# Patient Record
Sex: Female | Born: 1990 | Race: Black or African American | Hispanic: No | Marital: Single | Smoking: Never smoker
Health system: Southern US, Community
[De-identification: ages and names within clinical notes are randomized; demographics above are authoritative.]

## PROBLEM LIST (undated history)

## (undated) ENCOUNTER — Emergency Department: Admission: EM | Payer: BC Managed Care – PPO | Source: Home / Self Care

## (undated) DIAGNOSIS — R011 Cardiac murmur, unspecified: Secondary | ICD-10-CM

---

## 2017-06-29 DIAGNOSIS — F319 Bipolar disorder, unspecified: Secondary | ICD-10-CM | POA: Insufficient documentation

## 2019-04-04 ENCOUNTER — Encounter: Payer: Self-pay | Admitting: Physician Assistant

## 2019-04-04 ENCOUNTER — Other Ambulatory Visit: Payer: Self-pay

## 2019-04-04 ENCOUNTER — Ambulatory Visit: Payer: Self-pay | Admitting: Physician Assistant

## 2019-04-04 DIAGNOSIS — Z113 Encounter for screening for infections with a predominantly sexual mode of transmission: Secondary | ICD-10-CM

## 2019-04-04 LAB — PREGNANCY, URINE: Preg Test, Ur: NEGATIVE

## 2019-04-04 LAB — WET PREP FOR TRICH, YEAST, CLUE
Trichomonas Exam: NEGATIVE
Yeast Exam: NEGATIVE

## 2019-04-04 NOTE — Progress Notes (Signed)
  [facility] Department STI clinic/screening visit  Subjective:  Katherine Herring is a 29 y.o. female being seen today for an STI screening visit. The patient reports they do not have symptoms.  Patient reports that they do not desire a pregnancy in the next year.   They reported they are not interested in discussing contraception today.  Patient's last menstrual period was 03/28/2019 (exact date).   Patient has the following medical conditions:  There are no problems to display for this patient.   Chief Complaint  Patient presents with  . SEXUALLY TRANSMITTED DISEASE    HPI  Patient reports that she is not having any symptoms but would like a screening today.  Reports that her last period was shorter and lighter than usual and would like a pregnancy test to confirm that she is not pregnant.  See flowsheet for further details and programmatic requirements.    The following portions of the patient's history were reviewed and updated as appropriate: allergies, current medications, past medical history, past social history, past surgical history and problem list.  Objective:  There were no vitals filed for this visit.  Physical Exam Constitutional:      General: She is not in acute distress.    Appearance: Normal appearance. She is normal weight.  HENT:     Head: Normocephalic and atraumatic.     Comments: No nits, lice, or hair loss. No cervical, supraclavicular or axillary adenopathy.    Mouth/Throat:     Mouth: Mucous membranes are moist.     Pharynx: Oropharynx is clear. No oropharyngeal exudate or posterior oropharyngeal erythema.  Eyes:     Conjunctiva/sclera: Conjunctivae normal.  Pulmonary:     Effort: Pulmonary effort is normal.  Abdominal:     Palpations: Abdomen is soft. There is no mass.     Tenderness: There is no abdominal tenderness. There is no guarding or rebound.  Genitourinary:    General: Normal vulva.     Rectum: Normal.     Comments:  External genitalia/pubic area without nits, lice, edema, erythema, lesions and inguinal adenopathy. Vagina with normal mucosa and discharge. Cervix without visible lesions. Uterus firm, mobile, nt, no masses, no CMT, no adnexal tenderness or fullness. Musculoskeletal:     Cervical back: Neck supple. No tenderness.  Skin:    General: Skin is warm and dry.     Findings: No bruising, erythema, lesion or rash.  Neurological:     Mental Status: She is alert and oriented to person, place, and time.  Psychiatric:        Mood and Affect: Mood normal.        Behavior: Behavior normal.        Thought Content: Thought content normal.        Judgment: Judgment normal.      Assessment and Plan:  Nyaira CRISLYN WILLBANKS is a 29 y.o. female presenting to the [facility] Department for STI screening  1. Screening for STD (sexually transmitted disease) Patient into clinic without symptoms. Rec condoms with all sex. Await test results.  Counseled that RN will call if needs to RTC for treatment once results are back. Pregnancy test negative and results reviewed with patient. - WET PREP FOR TRICH, YEAST, CLUE - Pregnancy, urine - Gonococcus culture - Chlamydia/Gonorrhea [place] [facility] - HIV [place] [facility] - Syphilis Serology, [place] [facility]     No follow-ups on file.  No future appointments.  Matt Holmes, PA

## 2019-04-04 NOTE — Progress Notes (Signed)
UPT negative. Wet mount reviewed and no treatment indicated per standing orders. Jossie Ng, RN

## 2019-04-08 LAB — GONOCOCCUS CULTURE

## 2019-08-30 ENCOUNTER — Encounter: Payer: Self-pay | Admitting: Emergency Medicine

## 2019-08-30 ENCOUNTER — Emergency Department
Admission: EM | Admit: 2019-08-30 | Discharge: 2019-08-30 | Disposition: A | Discharge status: Home / Self Care | Payer: BC Managed Care – PPO | Attending: Student in an Organized Health Care Education/Training Program | Admitting: Student in an Organized Health Care Education/Training Program

## 2019-08-30 ENCOUNTER — Other Ambulatory Visit: Payer: Self-pay

## 2019-08-30 DIAGNOSIS — R5381 Other malaise: Secondary | ICD-10-CM | POA: Diagnosis not present

## 2019-08-30 DIAGNOSIS — F1590 Other stimulant use, unspecified, uncomplicated: Secondary | ICD-10-CM | POA: Diagnosis not present

## 2019-08-30 DIAGNOSIS — R110 Nausea: Secondary | ICD-10-CM | POA: Diagnosis present

## 2019-08-30 DIAGNOSIS — I441 Atrioventricular block, second degree: Secondary | ICD-10-CM

## 2019-08-30 LAB — COMPREHENSIVE METABOLIC PANEL
ALT: 13 U/L (ref 0–44)
AST: 18 U/L (ref 15–41)
Albumin: 3.9 g/dL (ref 3.5–5.0)
Alkaline Phosphatase: 44 U/L (ref 38–126)
Anion gap: 12 (ref 5–15)
BUN: 9 mg/dL (ref 6–20)
CO2: 24 mmol/L (ref 22–32)
Calcium: 9.1 mg/dL (ref 8.9–10.3)
Chloride: 102 mmol/L (ref 98–111)
Creatinine, Ser: 0.81 mg/dL (ref 0.44–1.00)
GFR calc Af Amer: >60 mL/min (ref >60)
GFR calc non Af Amer: >60 mL/min (ref >60)
Glucose, Bld: 91 mg/dL (ref 70–99)
Potassium: 3.5 mmol/L (ref 3.5–5.1)
Sodium: 138 mmol/L (ref 135–145)
Total Bilirubin: 0.4 mg/dL (ref 0.3–1.2)
Total Protein: 7.6 g/dL (ref 6.5–8.1)

## 2019-08-30 LAB — CBC
HCT: 38.2 % (ref 36.0–46.0)
Hemoglobin: 12.4 g/dL (ref 12.0–15.0)
MCH: 28.6 pg (ref 26.0–34.0)
MCHC: 32.5 g/dL (ref 30.0–36.0)
MCV: 88.2 fL (ref 80.0–100.0)
Platelets: 242 10*3/uL (ref 150–400)
RBC: 4.33 MIL/uL (ref 3.87–5.11)
RDW: 13.9 % (ref 11.5–15.5)
WBC: 4.6 10*3/uL (ref 4.0–10.5)
nRBC: 0 % (ref 0.0–0.2)

## 2019-08-30 LAB — TROPONIN I (HIGH SENSITIVITY): Troponin I (High Sensitivity): 3 ng/L (ref <18)

## 2019-08-30 LAB — URINALYSIS, COMPLETE (UACMP) WITH MICROSCOPIC
Bacteria, UA: NONE SEEN
Bilirubin Urine: NEGATIVE
Glucose, UA: NEGATIVE mg/dL
Hgb urine dipstick: NEGATIVE
Ketones, ur: NEGATIVE mg/dL
Leukocytes,Ua: NEGATIVE
Nitrite: NEGATIVE
Protein, ur: NEGATIVE mg/dL
Specific Gravity, Urine: 1.019 (ref 1.005–1.030)
pH: 6 (ref 5.0–8.0)

## 2019-08-30 LAB — LIPASE, BLOOD: Lipase: 40 U/L (ref 11–51)

## 2019-08-30 LAB — TSH: TSH: 1.363 u[IU]/mL (ref 0.350–4.500)

## 2019-08-30 LAB — T4, FREE: Free T4: 0.94 ng/dL (ref 0.61–1.12)

## 2019-08-30 LAB — POCT PREGNANCY, URINE: Preg Test, Ur: NEGATIVE

## 2019-08-30 MED ORDER — PROMETHAZINE HCL 12.5 MG PO TABS: 12.5000 mg | ORAL_TABLET | Freq: Four times a day (QID) | ORAL | 0 refills | Status: DC | PRN

## 2019-08-30 MED ORDER — SODIUM CHLORIDE 0.9 % IV BOLUS
1000.0000 mL | Freq: Once | INTRAVENOUS | Status: AC
  Administered 2019-08-30: 1000 mL via INTRAVENOUS

## 2019-08-30 NOTE — ED Provider Notes (Signed)
[facility] Emergency Department Provider Note    First MD Initiated Contact with Patient 08/30/19 1118     (approximate)  I have reviewed the triage vital signs and the nursing notes.   HISTORY  Chief Complaint Nausea    HPI Katherine Herring is a 29 y.o. female no significant past medical history other than cardiology follow-up for Mobitz type I block presents to the ER for several days of generalized weakness malaise.  She denies any chest pain or shortness of breath.  No fevers.  She is no new medications.  States she does have a history of high thyroid is following up with PCP.  Denies any nausea or vomiting but feels like she is dehydrated.   Denies any recent tick bites or exposures   History reviewed. No pertinent past medical history. History reviewed. No pertinent family history. History reviewed. No pertinent surgical history. There are no problems to display for this patient.     Prior to Admission medications   Medication Sig Start Date End Date Taking? Authorizing Provider  promethazine (PHENERGAN) 12.5 MG tablet Take 1 tablet (12.5 mg total) by mouth every 6 (six) hours as needed. 08/30/19   Willy Eddy, MD    Allergies Patient has no known allergies.    Social History Social History   Tobacco Use  . Smoking status: Never Smoker  . Smokeless tobacco: Never Used  Substance Use Topics  . Alcohol use: Never  . Drug use: Yes    Types: Marijuana    Review of Systems Patient denies headaches, rhinorrhea, blurry vision, numbness, shortness of breath, chest pain, edema, cough, abdominal pain, nausea, vomiting, diarrhea, dysuria, fevers, rashes or hallucinations unless otherwise stated above in HPI. ____________________________________________   PHYSICAL EXAM:  VITAL SIGNS: Vitals:   08/30/19 0900  BP: 123/78  Pulse: 79  Resp: 16  Temp: 98.4 F (36.9 C)  SpO2: 100%    Constitutional: Alert and oriented.    Eyes: Conjunctivae are normal.  Head: Atraumatic. Nose: No congestion/rhinnorhea. Mouth/Throat: Mucous membranes are moist.   Neck: No stridor. Painless ROM.  Cardiovascular: Normal rate, regular rhythm. Grossly normal heart sounds.  Good peripheral circulation. Respiratory: Normal respiratory effort.  No retractions. Lungs CTAB. Gastrointestinal: Soft and nontender. No distention. No abdominal bruits. No CVA tenderness. Genitourinary:  Musculoskeletal: No lower extremity tenderness nor edema.  No joint effusions. Neurologic:  Normal speech and language. No gross focal neurologic deficits are appreciated. No facial droop Skin:  Skin is warm, dry and intact. No rash noted. Psychiatric: Mood and affect are normal. Speech and behavior are normal.  ____________________________________________   LABS (all labs ordered are listed, but only abnormal results are displayed)  Results for orders placed or performed during the hospital encounter of 08/30/19 (from the past 24 hour(s))  Lipase, blood     Status: None   Collection Time: 08/30/19  9:06 AM  Result Value Ref Range   Lipase 40 11 - 51 U/L  Comprehensive metabolic panel     Status: None   Collection Time: 08/30/19  9:06 AM  Result Value Ref Range   Sodium 138 135 - 145 mmol/L   Potassium 3.5 3.5 - 5.1 mmol/L   Chloride 102 98 - 111 mmol/L   CO2 24 22 - 32 mmol/L   Glucose, Bld 91 70 - 99 mg/dL   BUN 9 6 - 20 mg/dL   Creatinine, Ser 8.18 0.44 - 1.00 mg/dL   Calcium 9.1 8.9 - 29.9 mg/dL  Total Protein 7.6 6.5 - 8.1 g/dL   Albumin 3.9 3.5 - 5.0 g/dL   AST 18 15 - 41 U/L   ALT 13 0 - 44 U/L   Alkaline Phosphatase 44 38 - 126 U/L   Total Bilirubin 0.4 0.3 - 1.2 mg/dL   GFR calc non Af Amer >60 >60 mL/min   GFR calc Af Amer >60 >60 mL/min   Anion gap 12 5 - 15  CBC     Status: None   Collection Time: 08/30/19  9:06 AM  Result Value Ref Range   WBC 4.6 4.0 - 10.5 K/uL   RBC 4.33 3.87 - 5.11 MIL/uL   Hemoglobin 12.4 12.0 -  15.0 g/dL   HCT 72.5 36 - 46 %   MCV 88.2 80.0 - 100.0 fL   MCH 28.6 26.0 - 34.0 pg   MCHC 32.5 30.0 - 36.0 g/dL   RDW 36.6 44.0 - 34.7 %   Platelets 242 150 - 400 K/uL   nRBC 0.0 0.0 - 0.2 %  Urinalysis, Complete w Microscopic     Status: Abnormal   Collection Time: 08/30/19  9:06 AM  Result Value Ref Range   Color, Urine YELLOW (A) YELLOW   APPearance CLEAR (A) CLEAR   Specific Gravity, Urine 1.019 1.005 - 1.030   pH 6.0 5.0 - 8.0   Glucose, UA NEGATIVE NEGATIVE mg/dL   Hgb urine dipstick NEGATIVE NEGATIVE   Bilirubin Urine NEGATIVE NEGATIVE   Ketones, ur NEGATIVE NEGATIVE mg/dL   Protein, ur NEGATIVE NEGATIVE mg/dL   Nitrite NEGATIVE NEGATIVE   Leukocytes,Ua NEGATIVE NEGATIVE   RBC / HPF 0-5 0 - 5 RBC/hpf   WBC, UA 0-5 0 - 5 WBC/hpf   Bacteria, UA NONE SEEN NONE SEEN   Squamous Epithelial / LPF 0-5 0 - 5   Mucus PRESENT   TSH     Status: None   Collection Time: 08/30/19  9:06 AM  Result Value Ref Range   TSH 1.363 0.350 - 4.500 uIU/mL  T4, free     Status: None   Collection Time: 08/30/19  9:06 AM  Result Value Ref Range   Free T4 0.94 0.61 - 1.12 ng/dL  Troponin I (High Sensitivity)     Status: None   Collection Time: 08/30/19  9:06 AM  Result Value Ref Range   Troponin I (High Sensitivity) 3 <18 ng/L  Pregnancy, urine POC     Status: None   Collection Time: 08/30/19  9:12 AM  Result Value Ref Range   Preg Test, Ur NEGATIVE NEGATIVE   ____________________________________________  EKG My review and personal interpretation at Time9:01   Indication: weakness  Rate: 70  Rhythm: mobitz type 1 Axis: normal Other: normal intervals, no stemi ____________________________________________  RADIOLOGY  I personally reviewed all radiographic images ordered to evaluate for the above acute complaints and reviewed radiology reports and findings.  These findings were personally discussed with the patient.  Please see medical record for radiology  report.  ____________________________________________   PROCEDURES  Procedure(s) performed:  Procedures    Critical Care performed: no ____________________________________________   INITIAL IMPRESSION / ASSESSMENT AND PLAN / ED COURSE  Pertinent labs & imaging results that were available during my care of the patient were reviewed by me and considered in my medical decision making (see chart for details).   DDX: Anemia, electrolyte abnormality, AKI, dysrhythmia, CHF, hypothyroid, tickborne illness, UTI, pregnancy, sepsis  Lashone K Teat is a 29 y.o. who presents to the ED  with symptoms as described above.  Patient is exceedingly well-appearing with reassuring exam.  Blood work is unremarkable.  Not consistent with ACS.  Does have known Mobitz type I and is still in that rhythm but with normal rate.  I doubt that that is causing her symptoms.  We will give her some IV fluid.  Thyroid panel is normal.  Does appear to be appropriate for outpatient follow-up.  Clinical Course as of Aug 29 1404  Tue Aug 30, 2019  1310 Patient remains hemodynamically stable with normal heart rate on the monitor.  She does have prolonged PR interval on repeat EKG given her history it seems consistent with Mobitz type I.  She is actually already called cardiology for close outpatient follow-up.  She appears well and in no acute distress medically she is appropriate for outpatient follow-up.   [PR]    Clinical Course User Index [PR] Willy Eddy, MD    The patient was evaluated in Emergency Department today for the symptoms described in the history of present illness. He/she was evaluated in the context of the global COVID-19 pandemic, which necessitated consideration that the patient might be at risk for infection with the SARS-CoV-2 virus that causes COVID-19. Institutional protocols and algorithms that pertain to the evaluation of patients at risk for COVID-19 are in a state of rapid change  based on information released by regulatory bodies including the CDC and federal and state organizations. These policies and algorithms were followed during the patient's care in the ED.  As part of my medical decision making, I reviewed the following data within the electronic MEDICAL RECORD NUMBER Nursing notes reviewed and incorporated, Labs reviewed, notes from prior ED visits and [place] Controlled Substance Database   ____________________________________________   FINAL CLINICAL IMPRESSION(S) / ED DIAGNOSES  Final diagnoses:  Malaise  Mobitz type 1 second degree atrioventricular block      NEW MEDICATIONS STARTED DURING THIS VISIT:  New Prescriptions   PROMETHAZINE (PHENERGAN) 12.5 MG TABLET    Take 1 tablet (12.5 mg total) by mouth every 6 (six) hours as needed.     Note:  This document was prepared using Dragon voice recognition software and may include unintentional dictation errors.    Willy Eddy, MD 08/30/19 1406

## 2019-08-30 NOTE — ED Notes (Addendum)
Patient discharged home, patient received discharge papers and prescription. Patient appropriate and cooperative. Vital signs taken. NAD noted. 

## 2019-08-30 NOTE — ED Notes (Signed)
Patient here today due to feeling tired and "not feeling like her usual self" she says she is usually an upbeat persona and past few days she has been tired and no appetite. She does see a cardiologist due to low heart rate. Patient is pleasant. NAD noted

## 2019-08-30 NOTE — ED Notes (Signed)
Patient given a [place] tray and gingerale. Patient ate 100%

## 2019-08-30 NOTE — ED Triage Notes (Addendum)
Pt here for nausea and feeling like getting sick. Pt denies pain but does not feel well.  Does have thyroid issues and supposed to have it taken out but has not and not taking her meds. NAD. + chills. No vomiting or diarrhea.  Pt c/o feeling overall weak.

## 2019-08-31 ENCOUNTER — Encounter (HOSPITAL_COMMUNITY): Payer: Self-pay | Admitting: Emergency Medicine

## 2019-08-31 ENCOUNTER — Emergency Department (HOSPITAL_COMMUNITY)
Admission: EM | Admit: 2019-08-31 | Discharge: 2019-08-31 | Disposition: A | Discharge status: Home / Self Care | Payer: BC Managed Care – PPO | Attending: Emergency Medicine | Admitting: Emergency Medicine

## 2019-08-31 ENCOUNTER — Other Ambulatory Visit: Payer: Self-pay

## 2019-08-31 DIAGNOSIS — R5383 Other fatigue: Secondary | ICD-10-CM | POA: Diagnosis present

## 2019-08-31 NOTE — ED Triage Notes (Signed)
Pt states seen at [facility] yesterday for generalized fatigue that has going on for the past few weeks, reports she was given some fluids at [facility] and discharged. Pt does report hx of 2nd degree heart block that she sees cards for.

## 2019-08-31 NOTE — ED Notes (Signed)
Labs done yesterday at [facility].

## 2019-08-31 NOTE — Discharge Instructions (Signed)
You were seen in the emergency department today for fatigue.  As discussed your labs from yesterday's visit have been reviewed, your blood counts, kidney function, liver function, electrolytes, and thyroid function were all reassuring.  Your EKG today showed some improvement from your EKG yesterday and is reassuring.  We are unsure of the exact cause of your fatigue, we would like you to follow-up closely with your outpatient primary care and cardiology providers.  Please return to the ER for any new or worsening symptoms including but not limited to worsening fatigue, trouble breathing, sensation of heart racing/skipping beats, dizziness, lightheadedness, passing out, chest pain, inability to keep fluids down, fever, rashes, or any other concerns that you may have.

## 2019-08-31 NOTE — ED Provider Notes (Addendum)
[facility] EMERGENCY DEPARTMENT Provider Note   CSN: [id number] Arrival date & time: 08/31/19  7782     History Chief Complaint  Patient presents with  . Fatigue    Katherine Herring is a 29 y.o. 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 Patient reports fatigue further described as lack of energy that occurs almost daily but not every day. No alleviating/aggravating factors. No intervention PTA. Denies fever, URI sxs, cough, dyspnea, chest pain, palpitations, abdominal pain, vomiting, diarrhea, urinary sxs, vaginal discharge, or rashes. Denies recent tick exposure. Denies concern for STI. Has not received COVID 19 vaccine, denies recent COVID exposures. She has follow up with her cardiologist scheduled 07/19.   HPI     History reviewed. No pertinent past medical history.  There are no problems to display for this patient.   History reviewed. No pertinent surgical history.   OB History   No obstetric history on file.     No family history on file.  Social History   Tobacco Use  . Smoking status: Never Smoker  . Smokeless tobacco: Never Used  Substance Use Topics  . Alcohol use: Never  . Drug use: Yes    Types: Marijuana    Home Medications Prior to Admission medications   Medication Sig Start Date End Date Taking? Authorizing Provider  promethazine (PHENERGAN) 12.5 MG tablet Take 1 tablet (12.5 mg total) by mouth every 6 (six) hours as needed. 08/30/19   Willy Eddy, MD    Allergies    Patient has no known allergies.  Review of Systems   Review of Systems  Constitutional: Positive for fatigue. Negative for fever.  HENT: Negative for congestion, ear pain and sore throat.   Respiratory: Negative for cough and shortness of breath.   Cardiovascular: Negative for chest pain and palpitations.  Gastrointestinal: Negative for abdominal pain, blood in  stool, constipation, diarrhea and vomiting.  Genitourinary: Negative for dysuria, vaginal bleeding and vaginal discharge.  Skin: Negative for rash.  Neurological: Negative for seizures, syncope and headaches.  All other systems reviewed and are negative.   Physical Exam Updated Vital Signs BP 132/88 (BP Location: Right Arm)   Pulse 67   Temp 97.9 F (36.6 C) (Oral)   Resp 17   LMP 08/08/2019   SpO2 100%   Physical Exam Vitals and nursing note reviewed.  Constitutional:      General: She is not in acute distress.    Appearance: She is well-developed.  HENT:     Head: Normocephalic and atraumatic.     Right Ear: Ear canal normal. Tympanic membrane is not perforated, erythematous, retracted or bulging.     Left Ear: Ear canal normal. Tympanic membrane is not perforated, erythematous, retracted or bulging.     Ears:     Comments: No mastoid erythema/swelling/tenderness.     Nose:     Right Sinus: No maxillary sinus tenderness or frontal sinus tenderness.     Left Sinus: No maxillary sinus tenderness or frontal sinus tenderness.     Mouth/Throat:     Pharynx: Uvula midline. No oropharyngeal exudate or posterior oropharyngeal erythema.     Comments: Posterior oropharynx is symmetric appearing. Patient tolerating own secretions without difficulty. No trismus. No drooling. No hot potato voice. No swelling beneath the tongue, submandibular compartment is soft.  Eyes:     General:  Right eye: No discharge.        Left eye: No discharge.     Conjunctiva/sclera: Conjunctivae normal.     Pupils: Pupils are equal, round, and reactive to light.  Cardiovascular:     Rate and Rhythm: Normal rate and regular rhythm.     Heart sounds: No murmur heard.   Pulmonary:     Effort: Pulmonary effort is normal. No respiratory distress.     Breath sounds: Normal breath sounds. No wheezing, rhonchi or rales.  Abdominal:     General: There is no distension.     Palpations: Abdomen is soft.       Tenderness: There is no abdominal tenderness. There is no guarding or rebound.  Musculoskeletal:     Cervical back: Normal range of motion and neck supple. No edema or rigidity.     Right lower leg: No edema.     Left lower leg: No edema.  Lymphadenopathy:     Cervical: No cervical adenopathy.  Skin:    General: Skin is warm and dry.     Findings: No rash.  Neurological:     General: No focal deficit present.     Mental Status: She is alert.  Psychiatric:        Behavior: Behavior normal.     ED Results / Procedures / Treatments   Labs (all labs ordered are listed, but only abnormal results are displayed) Labs Reviewed - No data to display . Results for orders placed or performed during the hospital encounter of 08/30/19  Lipase, blood  Result Value Ref Range   Lipase 40 11 - 51 U/L  Comprehensive metabolic panel  Result Value Ref Range   Sodium 138 135 - 145 mmol/L   Potassium 3.5 3.5 - 5.1 mmol/L   Chloride 102 98 - 111 mmol/L   CO2 24 22 - 32 mmol/L   Glucose, Bld 91 70 - 99 mg/dL   BUN 9 6 - 20 mg/dL   Creatinine, Ser 7.41 0.44 - 1.00 mg/dL   Calcium 9.1 8.9 - 28.7 mg/dL   Total Protein 7.6 6.5 - 8.1 g/dL   Albumin 3.9 3.5 - 5.0 g/dL   AST 18 15 - 41 U/L   ALT 13 0 - 44 U/L   Alkaline Phosphatase 44 38 - 126 U/L   Total Bilirubin 0.4 0.3 - 1.2 mg/dL   GFR calc non Af Amer >60 >60 mL/min   GFR calc Af Amer >60 >60 mL/min   Anion gap 12 5 - 15  CBC  Result Value Ref Range   WBC 4.6 4.0 - 10.5 K/uL   RBC 4.33 3.87 - 5.11 MIL/uL   Hemoglobin 12.4 12.0 - 15.0 g/dL   HCT 86.7 36 - 46 %   MCV 88.2 80.0 - 100.0 fL   MCH 28.6 26.0 - 34.0 pg   MCHC 32.5 30.0 - 36.0 g/dL   RDW 67.2 09.4 - 70.9 %   Platelets 242 150 - 400 K/uL   nRBC 0.0 0.0 - 0.2 %  Urinalysis, Complete w Microscopic  Result Value Ref Range   Color, Urine YELLOW (A) YELLOW   APPearance CLEAR (A) CLEAR   Specific Gravity, Urine 1.019 1.005 - 1.030   pH 6.0 5.0 - 8.0   Glucose, UA NEGATIVE  NEGATIVE mg/dL   Hgb urine dipstick NEGATIVE NEGATIVE   Bilirubin Urine NEGATIVE NEGATIVE   Ketones, ur NEGATIVE NEGATIVE mg/dL   Protein, ur NEGATIVE NEGATIVE mg/dL   Nitrite NEGATIVE NEGATIVE  Leukocytes,Ua NEGATIVE NEGATIVE   RBC / HPF 0-5 0 - 5 RBC/hpf   WBC, UA 0-5 0 - 5 WBC/hpf   Bacteria, UA NONE SEEN NONE SEEN   Squamous Epithelial / LPF 0-5 0 - 5   Mucus PRESENT   TSH  Result Value Ref Range   TSH 1.363 0.350 - 4.500 uIU/mL  T4, free  Result Value Ref Range   Free T4 0.94 0.61 - 1.12 ng/dL  Pregnancy, urine POC  Result Value Ref Range   Preg Test, Ur NEGATIVE NEGATIVE  Troponin I (High Sensitivity)  Result Value Ref Range   Troponin I (High Sensitivity) 3 <18 ng/L   No results found.  EKG EKG Interpretation  Date/Time:  Wednesday August 31 2019 14:25:49 EDT Ventricular Rate:  64 PR Interval:    QRS Duration: 83 QT Interval:  405 QTC Calculation: 418 R Axis:   93 Text Interpretation: Right and left arm electrode reversal, interpretation assumes no reversal Sinus rhythm Prolonged PR interval Borderline right axis deviation PR prolongation improved from yesterday Confirmed by Meridee ScoreButler, Michael [phone]([id number]) on 08/31/2019 2:34:30 PM   Radiology No results found.  Procedures Procedures (including critical care time)  Medications Ordered in ED Medications - No data to display  ED Course  I have reviewed the triage vital signs and the nursing notes.  Pertinent labs & imaging results that were available during my care of the patient were reviewed by me and considered in my medical decision making (see chart for details).  Katherine Herring was evaluated in Emergency Department on 08/31/2019 for the symptoms described in the history of present illness. He/she was evaluated in the context of the global COVID-19 pandemic, which necessitated consideration that the patient might be at risk for infection with the SARS-CoV-2 virus that causes COVID-19. Institutional protocols  and algorithms that pertain to the evaluation of patients at risk for COVID-19 are in a state of rapid change based on information released by regulatory bodies including the CDC and federal and state organizations. These policies and algorithms were followed during the patient's care in the ED.    MDM Rules/Calculators/A&P                         Patient returns to the emergency department setting for evaluation of fatigue over the past 2 to 3 weeks.  She is nontoxic, resting comfortably, her vitals are within normal limits.  She has a completely benign physical exam.  Her work-up from her emergency department room visit yesterday has been thoroughly reviewed.  She had laboratory evaluation including CBC, CMP, lipase, urinalysis, pregnancy test, TSH, T4, free, and troponin as well as an EKG performed.  Her labs did not show any significant anemia, electrolyte derangement, renal failure, liver failure, pancreatitis, thyroid dysfunction, UTI, or pregnancy.  Given her labs were performed within the past 36 hours and her symptoms have not greatly changed do not feel these need repeating.  She is afebrile without obvious identifiable infection on physical exam, additionally no leukocytosis yesterday.  She has no recent tick exposures or rashes to raise concern for [facility] spotted fever.  She has no respiratory complaints, heart RRR, lungs CTA. No signs of respiratory distress. Abdominal exam is benign. She is tolerating PO. We discussed the option of COVID-19 as well as HIV testing for further assessment which she has declined.   An EKG was obtained in the emergency department today- Right and left arm electrode reversal, interpretation  assumes no reversal Sinus rhythm Prolonged PR interval Borderline right axis deviation PR prolongation improved from yesterday. She is overall very well-appearing and does not appear to be in any distress, her vitals are within normal limits, she overall appears appropriate for  discharge home with close outpatient PCP and cardiology follow-up. I discussed results, treatment plan, need for follow-up, and return precautions with the patient.. Provided opportunity for questions, patient confirmed understanding, however was quite frustrated and left the ED without her discharge instructions unfortunately.   Findings and plan of care discussed with supervising physician Dr. Charm Barges who is in agreement.   Final Clinical Impression(s) / ED Diagnoses Final diagnoses:  Fatigue, unspecified type    Rx / DC Orders ED Discharge Orders    None       Cherly Anderson, PA-C 08/31/19 [id number] PA-C 08/31/19 1549    Terrilee Files, MD 08/31/19 1929

## 2019-09-08 ENCOUNTER — Other Ambulatory Visit: Payer: Self-pay

## 2019-09-08 DIAGNOSIS — R002 Palpitations: Secondary | ICD-10-CM | POA: Insufficient documentation

## 2019-09-08 DIAGNOSIS — R61 Generalized hyperhidrosis: Secondary | ICD-10-CM | POA: Diagnosis not present

## 2019-09-08 LAB — COMPREHENSIVE METABOLIC PANEL
ALT: 12 U/L (ref 0–44)
AST: 18 U/L (ref 15–41)
Albumin: 3.9 g/dL (ref 3.5–5.0)
Alkaline Phosphatase: 42 U/L (ref 38–126)
Anion gap: 9 (ref 5–15)
BUN: 8 mg/dL (ref 6–20)
CO2: 24 mmol/L (ref 22–32)
Calcium: 8.8 mg/dL — ABNORMAL LOW (ref 8.9–10.3)
Chloride: 105 mmol/L (ref 98–111)
Creatinine, Ser: 0.79 mg/dL (ref 0.44–1.00)
GFR calc Af Amer: >60 mL/min (ref >60)
GFR calc non Af Amer: >60 mL/min (ref >60)
Glucose, Bld: 134 mg/dL — ABNORMAL HIGH (ref 70–99)
Potassium: 3 mmol/L — ABNORMAL LOW (ref 3.5–5.1)
Sodium: 138 mmol/L (ref 135–145)
Total Bilirubin: 0.7 mg/dL (ref 0.3–1.2)
Total Protein: 7.4 g/dL (ref 6.5–8.1)

## 2019-09-08 LAB — TROPONIN I (HIGH SENSITIVITY): Troponin I (High Sensitivity): 2 ng/L (ref <18)

## 2019-09-08 LAB — CBC
HCT: 38.4 % (ref 36.0–46.0)
Hemoglobin: 12.8 g/dL (ref 12.0–15.0)
MCH: 29 pg (ref 26.0–34.0)
MCHC: 33.3 g/dL (ref 30.0–36.0)
MCV: 86.9 fL (ref 80.0–100.0)
Platelets: 241 10*3/uL (ref 150–400)
RBC: 4.42 MIL/uL (ref 3.87–5.11)
RDW: 13.6 % (ref 11.5–15.5)
WBC: 5.1 10*3/uL (ref 4.0–10.5)
nRBC: 0 % (ref 0.0–0.2)

## 2019-09-08 NOTE — ED Triage Notes (Signed)
Pt here for palpitations that started today with increased sweating. Does see a cardiologist for an irregular rate and hx of 1st and 2nd degree heart blocks.

## 2019-09-09 ENCOUNTER — Emergency Department
Admission: EM | Admit: 2019-09-09 | Discharge: 2019-09-09 | Disposition: A | Discharge status: Home / Self Care | Payer: BC Managed Care – PPO | Attending: Emergency Medicine | Admitting: Emergency Medicine

## 2019-09-09 DIAGNOSIS — R002 Palpitations: Secondary | ICD-10-CM

## 2019-09-09 LAB — TROPONIN I (HIGH SENSITIVITY): Troponin I (High Sensitivity): 3 ng/L (ref <18)

## 2019-09-09 LAB — MAGNESIUM: Magnesium: 2 mg/dL (ref 1.7–2.4)

## 2019-09-09 LAB — HCG, QUANTITATIVE, PREGNANCY: hCG, Beta Chain, Quant, S: <1 m[IU]/mL (ref <5)

## 2019-09-09 MED ORDER — POTASSIUM CHLORIDE CRYS ER 20 MEQ PO TBCR
40.0000 meq | EXTENDED_RELEASE_TABLET | Freq: Once | ORAL | Status: AC
  Administered 2019-09-09: 40 meq via ORAL
  Filled 2019-09-09: qty 2

## 2019-09-09 NOTE — ED Notes (Signed)
Pt ambulatory to STAT desk to inquire over wait time; pt updated on such; pt st "I'm getting really ancy out here, and go ahead and let that doctor know they ain't gonna play with me like they did last time or I'm gonna go the fuck off cause they gonna tell me what's wrong or else"

## 2019-09-09 NOTE — ED Provider Notes (Signed)
[facility] Emergency Department Provider Note  ____________________________________________  Time seen: Approximately 5:48 AM  I have reviewed the triage vital signs and the nursing notes.   HISTORY  Chief Complaint Palpitations   HPI Katherine Herring is a 29 y.o. 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  Patient reports have an episode of palpitations lasting several hours earlier today.  She reports that throughout the day today she was feeling hot and cold and sweating a lot.  No chest pain or shortness of breath, no palpitations at this time.  No dizziness or syncope.  No fever or chills.  No nausea, vomiting, cough, dysuria, hematuria, diarrhea or constipation.  No personal or family history of blood clots, no recent travel immobilization, no leg pain or swelling, no hemoptysis or exogenous hormones.  No history of thyroid disease.   She is followed by cardiology for her AV block but has never had palpitations before.  PMH AV block  Prior to Admission medications   Medication Sig Start Date End Date Taking? Authorizing Provider  promethazine (PHENERGAN) 12.5 MG tablet Take 1 tablet (12.5 mg total) by mouth every 6 (six) hours as needed. 08/30/19   Willy Eddy, MD    Allergies Patient has no known allergies.  No family history on file.  Social History Social History   Tobacco Use  . Smoking status: Never Smoker  . Smokeless tobacco: Never Used  Substance Use Topics  . Alcohol use: Never  . Drug use: Yes    Types: Marijuana    Review of Systems  Constitutional: Negative for fever. Eyes: Negative for visual changes. ENT: Negative for sore throat. Neck: No neck pain  Cardiovascular: Negative for chest pain. + palpitations Respiratory: Negative for shortness of breath. Gastrointestinal: Negative for abdominal pain, vomiting or diarrhea. Genitourinary:  Negative for dysuria. Musculoskeletal: Negative for back pain. Skin: Negative for rash. Neurological: Negative for headaches, weakness or numbness. Psych: No SI or HI  ____________________________________________   PHYSICAL EXAM:  VITAL SIGNS: ED Triage Vitals  Enc Vitals Group     BP 09/08/19 2155 (!) 130/87     Pulse Rate 09/08/19 2155 72     Resp 09/08/19 2155 20     Temp 09/08/19 2155 98.8 F (37.1 C)     Temp Source 09/08/19 2155 Oral     SpO2 09/08/19 2155 100 %     Weight 09/08/19 2156 (!) 230 lb (104.3 kg)     Height 09/08/19 2156 5\' 3"  (1.6 m)     Head Circumference --      Peak Flow --      Pain Score 09/08/19 2156 0     Pain Loc --      Pain Edu? --      Excl. in GC? --     Constitutional: Alert and oriented. Well appearing and in no apparent distress. HEENT:      Head: Normocephalic and atraumatic.         Eyes: Conjunctivae are normal. Sclera is non-icteric.       Mouth/Throat: Mucous membranes are moist.       Neck: Supple with no signs of meningismus. Cardiovascular: Regular rate and rhythm. No murmurs, gallops, or rubs. 2+ symmetrical distal pulses are present in all extremities. No JVD. Respiratory: Normal respiratory effort. Lungs are clear to auscultation bilaterally. No wheezes, crackles, or rhonchi.  Gastrointestinal: Soft, non tender. Musculoskeletal:  No edema,  cyanosis, or erythema of extremities. Neurologic: Normal speech and language. Face is symmetric. Moving all extremities. No gross focal neurologic deficits are appreciated. Skin: Skin is warm, dry and intact. No rash noted. Psychiatric: Mood and affect are normal. Speech and behavior are normal.  ____________________________________________   LABS (all labs ordered are listed, but only abnormal results are displayed)  Labs Reviewed  COMPREHENSIVE METABOLIC PANEL - Abnormal; Notable for the following components:      Result Value   Potassium 3.0 (*)    Glucose, Bld 134 (*)    Calcium  8.8 (*)    All other components within normal limits  CBC  MAGNESIUM  HCG, QUANTITATIVE, PREGNANCY  TROPONIN I (HIGH SENSITIVITY)  TROPONIN I (HIGH SENSITIVITY)   ____________________________________________  EKG  ED ECG REPORT I, Nita Sickle, the attending physician, personally viewed and interpreted this ECG.  Sinus bradycardia with first-degree AV block, rate of 58, normal QTC, normal axis, unchanged from baseline. ____________________________________________  RADIOLOGY  none  ____________________________________________   PROCEDURES  Procedure(s) performed:yes .1-3 Lead EKG Interpretation Performed by: Nita Sickle, MD Authorized by: Nita Sickle, MD     Interpretation: abnormal     ECG rate assessment: bradycardic     Rhythm: sinus bradycardia     Ectopy: none     Conduction: abnormal     Abnormal conduction: 1st degree AV block     Critical Care performed:  None ____________________________________________   INITIAL IMPRESSION / ASSESSMENT AND PLAN / ED COURSE  29 y.o. 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  Patient is extremely well-appearing in no distress with normal vital signs, heart regular rate and rhythm with no murmurs.  EKG showing first-degree AV block which patient has a history of.  She was monitored on telemetry for over an hour and showed a few episodes of second-degree AV block type II.  Patient reports feeling palpitations during these episodes.  No signs of SVT, A. fib, V. tach, or any other dysrhythmias.  Lab work showing mild hypokalemia which is supplemented p.o.  Normal magnesium.  Troponin x2 -.  No anemia.  Patient is being referred by her cardiologist to an electrophysiologist for management of these dysrhythmias.  Recommended keeping that appointment. Doubt PE with no tachycardia, no tachypnea, no hypoxia, no chest pain or shortness of breath.   Patient is PERC negative.  Thyroid studies were not checked today since they were done 10 days ago and were normal.  Review of epic also shows the patient recently had a Holter monitor that only showed first-degree AV block and second-degree type II.  No other dysrhythmias on Holter monitoring.  At this time patient is stable for discharge with close follow-up.  Discussed my standard return precautions.      _____________________________________________ Please note:  Patient was evaluated in Emergency Department today for the symptoms described in the history of present illness. Patient was evaluated in the context of the global COVID-19 pandemic, which necessitated consideration that the patient might be at risk for infection with the SARS-CoV-2 virus that causes COVID-19. Institutional protocols and algorithms that pertain to the evaluation of patients at risk for COVID-19 are in a state of rapid change based on information released by regulatory bodies including the CDC and federal and state organizations. These policies and algorithms were followed during the patient's care in the ED.  Some ED evaluations and interventions may be delayed as a result of limited staffing  during the pandemic.   [place] Controlled Substance Database was reviewed by me. ____________________________________________   FINAL CLINICAL IMPRESSION(S) / ED DIAGNOSES   Final diagnoses:  Palpitations      NEW MEDICATIONS STARTED DURING THIS VISIT:  ED Discharge Orders    None       Note:  This document was prepared using Dragon voice recognition software and may include unintentional dictation errors.    Don Perking, [place], MD 09/09/19 [phone]

## 2019-09-23 ENCOUNTER — Encounter: Payer: Self-pay | Admitting: Physician Assistant

## 2019-09-23 ENCOUNTER — Ambulatory Visit: Payer: Self-pay | Admitting: Physician Assistant

## 2019-09-23 ENCOUNTER — Other Ambulatory Visit: Payer: Self-pay

## 2019-09-23 DIAGNOSIS — Z113 Encounter for screening for infections with a predominantly sexual mode of transmission: Secondary | ICD-10-CM

## 2019-09-23 LAB — WET PREP FOR TRICH, YEAST, CLUE
Trichomonas Exam: NEGATIVE
Yeast Exam: NEGATIVE

## 2019-09-23 NOTE — Progress Notes (Signed)
[facility] Department STI clinic/screening visit  Subjective:  Katherine Herring is a 29 y.o. female being seen today for an STI screening visit. The patient reports they do not have symptoms.  Patient reports that they do not desire a pregnancy in the next year.   They reported they are not interested in discussing contraception today.  Patient's last menstrual period was 09/04/2019.   Patient has the following medical conditions:   Patient Active Problem List   Diagnosis Date Noted  . Bipolar disorder (HCC) 06/29/2017    Chief Complaint  Patient presents with  . SEXUALLY TRANSMITTED DISEASE    screening    HPI  Patient reports that she is not having any symptoms but would like a screening today.  Denies chronic conditions, surgeries and regular medicines.  States had a rapid HIV test 2 weeks ago and last pap was about 1.5 years ago.     See flowsheet for further details and programmatic requirements.    The following portions of the patient's history were reviewed and updated as appropriate: allergies, current medications, past medical history, past social history, past surgical history and problem list.  Objective:  There were no vitals filed for this visit.  Physical Exam Constitutional:      General: She is not in acute distress.    Appearance: Normal appearance.  HENT:     Head: Normocephalic and atraumatic.     Comments: No nits, lice or hair loss. No cervical, supraclavicular or axillary adenopathy.     Mouth/Throat:     Mouth: Mucous membranes are moist.     Pharynx: Oropharynx is clear. No oropharyngeal exudate or posterior oropharyngeal erythema.  Eyes:     Conjunctiva/sclera: Conjunctivae normal.  Pulmonary:     Effort: Pulmonary effort is normal.  Abdominal:     Palpations: Abdomen is soft. There is no mass.     Tenderness: There is no abdominal tenderness. There is no guarding or rebound.  Genitourinary:    General: Normal vulva.      Rectum: Normal.     Comments: External genitalia/pubic area without nits, lice, edema, erythema, lesions and inguinal adenopathy. Vagina with normal mucosa and discharge. Cervix without visible lesions. Uterus firm, mobile, nt, no masses no CMT, no adnexal tenderness or fullness. Musculoskeletal:     Cervical back: Neck supple. No tenderness.  Skin:    General: Skin is warm and dry.     Findings: No bruising, erythema, lesion or rash.  Neurological:     Mental Status: She is alert and oriented to person, place, and time.  Psychiatric:        Mood and Affect: Mood normal.        Behavior: Behavior normal.        Thought Content: Thought content normal.        Judgment: Judgment normal.      Assessment and Plan:  Katherine Herring is a 29 y.o. female presenting to the [facility] Department for STI screening  1. Screening for STD (sexually transmitted disease) Patient into clinic without symptoms. Rec condoms with all sex. Await test results.  Counseled that RN will call if needs to RTC for treatment once results are back. - WET PREP FOR TRICH, YEAST, CLUE - Gonococcus culture - Chlamydia/Gonorrhea [place] - HIV [facility] - Syphilis Serology, [facility]     Return in 3 weeks (on 10/14/2019) for TR appt, and PRN.  Future Appointments  Date  Time Provider Department Center  10/14/2019  9:15 AM [facility] NURSE [facility] None    Katherine Herring, [place]

## 2019-09-23 NOTE — Progress Notes (Signed)
Wet mount reviewed and is negative today, so no treatment needed for wet mount per standing order. Awaiting further TR's. Pt scheduled for TR appt for 10/14/2019 at 9:15am per pt request. Pt aware of appt date and time and to arrive at 9:00am for check-in. Provider orders completed.

## 2019-09-27 LAB — GONOCOCCUS CULTURE

## 2019-10-14 ENCOUNTER — Other Ambulatory Visit: Payer: Self-pay

## 2019-10-20 ENCOUNTER — Other Ambulatory Visit: Payer: Self-pay

## 2019-10-20 ENCOUNTER — Ambulatory Visit
Admission: EM | Admit: 2019-10-20 | Discharge: 2019-10-20 | Disposition: A | Discharge status: Home / Self Care | Payer: BC Managed Care – PPO | Attending: Family Medicine | Admitting: Family Medicine

## 2019-10-20 ENCOUNTER — Encounter: Payer: Self-pay | Admitting: Emergency Medicine

## 2019-10-20 DIAGNOSIS — Z7251 High risk heterosexual behavior: Secondary | ICD-10-CM | POA: Diagnosis not present

## 2019-10-20 DIAGNOSIS — Z79899 Other long term (current) drug therapy: Secondary | ICD-10-CM | POA: Insufficient documentation

## 2019-10-20 DIAGNOSIS — Z20822 Contact with and (suspected) exposure to covid-19: Secondary | ICD-10-CM | POA: Insufficient documentation

## 2019-10-20 DIAGNOSIS — N760 Acute vaginitis: Secondary | ICD-10-CM

## 2019-10-20 DIAGNOSIS — I441 Atrioventricular block, second degree: Secondary | ICD-10-CM | POA: Diagnosis not present

## 2019-10-20 DIAGNOSIS — F319 Bipolar disorder, unspecified: Secondary | ICD-10-CM | POA: Insufficient documentation

## 2019-10-20 DIAGNOSIS — B9689 Other specified bacterial agents as the cause of diseases classified elsewhere: Secondary | ICD-10-CM | POA: Diagnosis not present

## 2019-10-20 LAB — URINALYSIS, COMPLETE (UACMP) WITH MICROSCOPIC
Bilirubin Urine: NEGATIVE
Glucose, UA: NEGATIVE mg/dL
Hgb urine dipstick: NEGATIVE
Ketones, ur: NEGATIVE mg/dL
Leukocytes,Ua: NEGATIVE
Nitrite: NEGATIVE
Protein, ur: NEGATIVE mg/dL
Specific Gravity, Urine: 1.025 (ref 1.005–1.030)
pH: 6.5 (ref 5.0–8.0)

## 2019-10-20 LAB — WET PREP, GENITAL
Sperm: NONE SEEN
Trich, Wet Prep: NONE SEEN
WBC, Wet Prep HPF POC: NONE SEEN
Yeast Wet Prep HPF POC: NONE SEEN

## 2019-10-20 LAB — PREGNANCY, URINE: Preg Test, Ur: NEGATIVE

## 2019-10-20 LAB — GROUP A STREP BY PCR: Group A Strep by PCR: NOT DETECTED

## 2019-10-20 MED ORDER — METRONIDAZOLE 500 MG PO TABS: 500.0000 mg | ORAL_TABLET | Freq: Two times a day (BID) | ORAL | 0 refills | Status: AC

## 2019-10-20 MED ORDER — CEFTRIAXONE SODIUM 500 MG IJ SOLR
500.0000 mg | Freq: Once | INTRAMUSCULAR | Status: AC
  Administered 2019-10-20: 500 mg via INTRAMUSCULAR

## 2019-10-20 MED ORDER — DOXYCYCLINE HYCLATE 100 MG PO CAPS: 100.0000 mg | ORAL_CAPSULE | Freq: Two times a day (BID) | ORAL | 0 refills | Status: AC

## 2019-10-20 NOTE — ED Triage Notes (Signed)
Patient c/o back pain, dysuria that started 2 days ago. She also is requesting COVID testing, no symptoms.

## 2019-10-20 NOTE — ED Provider Notes (Signed)
[facility]    CSN: [id number] Arrival date & time: 10/20/19  1829      History   Chief Complaint Chief Complaint  Patient presents with  . Dysuria    HPI Katherine Herring is a 29 y.o. female.   HPI  Endorses urinary frequency, urgency, intermittent low back pain, and abdominal "pressure".  Patient states it is almost like she is not completely emptying her bladder after urinating. Patient has unprotected sex. Last was about 10  days ago. Denies fever, dysuria, vaginal itching and discharge. Unsure if she is pregnant. Her new  partner had not reported any STI symptoms. Endorses pelvic pressure.   Requests COVID testing for school.  Coughing up some mucus for past 3 days Has tonsils exudates that started today. Had troubling swallowing food today due to the sensation of needing to clear her throat.   History reviewed. 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 Bipolar disorder (HCC) 06/29/2017    History reviewed. No pertinent surgical history.  OB History   No obstetric history on file.   G1P0    Home Medications    Prior to Admission medications   Medication Sig Start Date End Date Taking? Authorizing Provider  doxycycline (VIBRAMYCIN) 100 MG capsule Take 1 capsule (100 mg total) by mouth 2 (two) times daily for 7 days. 10/20/19 10/27/19  Katha Cabal, DO  metroNIDAZOLE (FLAGYL) 500 MG tablet Take 1 tablet (500 mg total) by mouth 2 (two) times daily for 7 days. 10/20/19 10/27/19  Katha Cabal, DO  promethazine (PHENERGAN) 12.5 MG tablet Take 1 tablet (12.5 mg total) by mouth every 6 (six) hours as needed. 08/30/19   Willy Eddy, MD    Family History Family History  Problem Relation Age of Onset  . Hypertension Mother   . Hypertension Father     Social History Social History   Tobacco Use  . Smoking status: Never Smoker  . Smokeless tobacco: Never Used  Vaping Use  . Vaping Use: Never used    Substance Use Topics  . Alcohol use: Not Currently  . Drug use: Yes    Types: Marijuana     Allergies   Patient has no known allergies.   Review of Systems  Review of Systems  Constitutional: Negative for chills and fever.  HENT: Positive for postnasal drip and trouble swallowing.   Respiratory: Positive for cough. Negative for shortness of breath.   Cardiovascular: Negative for chest pain.  Gastrointestinal: Positive for nausea. Negative for abdominal pain, diarrhea and vomiting.  Genitourinary: Positive for frequency and urgency. Negative for dysuria, vaginal bleeding and vaginal discharge.  Musculoskeletal: Positive for back pain.  Neurological: Negative for headaches.  All other systems reviewed and are negative.   Physical Exam Triage Vital Signs ED Triage Vitals  Enc Vitals Group     BP 10/20/19 1908 114/67     Pulse Rate 10/20/19 1908 65     Resp 10/20/19 1908 18     Temp 10/20/19 1908 99.1 F (37.3 C)     Temp Source 10/20/19 1908 Oral     SpO2 10/20/19 1908 100 %     Weight 10/20/19 1905 230 lb (104.3 kg)     Height 10/20/19 1905 5\' 3"  (1.6 m)     Head Circumference --      Peak Flow --      Pain Score 10/20/19 1904 4     Pain  Loc --      Pain Edu? --      Excl. in GC? --    No data found.  Updated Vital Signs BP 114/67 (BP Location: Right Arm)   Pulse 65   Temp 99.1 F (37.3 C) (Oral)   Resp 18   Ht 5\' 3"  (1.6 m)   Wt 230 lb (104.3 kg)   LMP 09/29/2019   SpO2 100%   BMI 40.74 kg/m   Visual Acuity Right Eye Distance:   Left Eye Distance:   Bilateral Distance:    Right Eye Near:   Left Eye Near:    Bilateral Near:     Physical Exam Vitals and nursing note reviewed.  Constitutional:      Appearance: Normal appearance.  HENT:     Right Ear: External ear normal.     Left Ear: External ear normal.     Nose: Nose normal.     Mouth/Throat:     Mouth: Mucous membranes are moist.     Pharynx: Oropharynx is clear. No oropharyngeal exudate  or posterior oropharyngeal erythema.  Eyes:     Extraocular Movements: Extraocular movements intact.     Conjunctiva/sclera: Conjunctivae normal.  Cardiovascular:     Rate and Rhythm: Normal rate. Rhythm irregular.     Pulses: Normal pulses.     Heart sounds: Normal heart sounds.  Pulmonary:     Effort: Pulmonary effort is normal.     Breath sounds: Normal breath sounds.  Abdominal:     General: Bowel sounds are normal.     Palpations: Abdomen is soft. There is no mass.     Tenderness: There is no abdominal tenderness. There is no right CVA tenderness, left CVA tenderness, guarding or rebound.     Hernia: No hernia is present.  Musculoskeletal:        General: Normal range of motion.     Cervical back: Normal range of motion.  Skin:    General: Skin is warm and dry.     Capillary Refill: Capillary refill takes less than 2 seconds.  Neurological:     Mental Status: She is alert and oriented to person, place, and time. Mental status is at baseline.  Psychiatric:        Mood and Affect: Mood normal.        Behavior: Behavior normal.     UC Treatments / Results  Labs (all labs ordered are listed, but only abnormal results are displayed) Labs Reviewed  WET PREP, GENITAL - Abnormal; Notable for the following components:      Result Value   Clue Cells Wet Prep HPF POC PRESENT (*)    All other components within normal limits  URINALYSIS, COMPLETE (UACMP) WITH MICROSCOPIC - Abnormal; Notable for the following components:   Bacteria, UA FEW (*)    All other components within normal limits  GROUP A STREP BY PCR  SARS CORONAVIRUS 2 (TAT 6-24 HRS)  CHLAMYDIA/NGC RT PCR ([facility] ONLY)  PREGNANCY, URINE    EKG   Radiology No results found.  Procedures Procedures (including critical care time)  Medications Ordered in UC Medications  cefTRIAXone (ROCEPHIN) injection 500 mg (has no administration in time range)    Initial Impression / Assessment and Plan / UC Course    I have reviewed the triage vital signs and the nursing notes.  Pertinent labs & imaging results that were available during my care of the patient were reviewed by me and considered in my medical decision  making (see chart for details).     High risk heterosexual behavior: Patient without contraception.  She states she took a plan B after her unprotected.  She requests treatment for STIs as she had unprotected sex with a new partner. Advised patient to speak with PCP about contraception.   Labs consistent with BV. GC and chlamydia DNA urine cytology sent to lab. Negative Trichomoniasis on wet prep.  - Treatment: 0.5 gm CTX, doxycycline 100 mg BID x7 days, Flagyl 500 BID x 7 days and abstain from coitus during course of treatment. Advised patient to not drink alcohol while taking Flagyl.  - F/U if symptoms not improving or getting worse.  - Self care instructions given including avoiding douching. Handout given.  - F/U with PCP as needed.  - Return precautions including abdominal pain, fever, chills, nausea, or vomiting given.       Final Clinical Impressions(s) / UC Diagnoses   Final diagnoses:  BV (bacterial vaginosis)     Discharge Instructions     You were seen at the Urgent Care for urinary frequency and urgency. Please pick up your prescriptions at your pharmacy. Be sure to take your antibiotics. Follow up with your PCP as needed.   If you haven't already, sign up for My Chart to have easy access to your labs results, and communication with your primary care physician.  Dr. Rachael Darby      ED Prescriptions    Medication Sig Dispense Auth. Provider   doxycycline (VIBRAMYCIN) 100 MG capsule Take 1 capsule (100 mg total) by mouth 2 (two) times daily for 7 days. 14 capsule Daud Cayer, DO   metroNIDAZOLE (FLAGYL) 500 MG tablet Take 1 tablet (500 mg total) by mouth 2 (two) times daily for 7 days. 14 tablet Melody Savidge, Seward Meth, DO     PDMP not reviewed this encounter.    Katha Cabal, DO 10/20/19 2006

## 2019-10-20 NOTE — Discharge Instructions (Addendum)
You were seen at the Urgent Care for urinary frequency and urgency. Please pick up your prescriptions at your pharmacy. Be sure to take your antibiotics. Follow up with your PCP as needed.   If you haven't already, sign up for My Chart to have easy access to your labs results, and communication with your primary care physician.  Dr. Rachael Darby

## 2019-10-21 LAB — CHLAMYDIA/NGC RT PCR (ARMC ONLY)
Chlamydia Tr: NOT DETECTED
N gonorrhoeae: NOT DETECTED

## 2019-10-21 LAB — SARS CORONAVIRUS 2 (TAT 6-24 HRS): SARS Coronavirus 2: NEGATIVE

## 2019-10-24 ENCOUNTER — Other Ambulatory Visit: Payer: Self-pay

## 2019-10-24 ENCOUNTER — Ambulatory Visit
Admission: EM | Admit: 2019-10-24 | Discharge: 2019-10-24 | Disposition: A | Discharge status: Home / Self Care | Payer: BC Managed Care – PPO | Attending: Physician Assistant | Admitting: Physician Assistant

## 2019-10-24 DIAGNOSIS — R110 Nausea: Secondary | ICD-10-CM | POA: Diagnosis present

## 2019-10-24 HISTORY — DX: Cardiac murmur, unspecified: R01.1

## 2019-10-24 LAB — PREGNANCY, URINE: Preg Test, Ur: NEGATIVE

## 2019-10-24 MED ORDER — ONDANSETRON HCL 4 MG PO TABS: 4.0000 mg | ORAL_TABLET | Freq: Three times a day (TID) | ORAL | 0 refills | Status: DC | PRN

## 2019-10-24 NOTE — Discharge Instructions (Addendum)
Stop the doxycycline  Take pepcid over the couter Use the zofran only as needed  Eat small meals and hydrate well  Follow-up with your primary care tomorrow

## 2019-10-24 NOTE — ED Provider Notes (Signed)
[facility]    CSN: [id number] Arrival date & time: 10/24/19  1835      History   Chief Complaint Chief Complaint  Patient presents with   Nausea    HPI Katherine Herring is a 29 y.o. female.   Patient reports for nausea.  This started this morning and persisted for about 4 hours throughout the morning.  She reports that her intermission nausea throughout the day.  No vomiting.  Denies abdominal pain.  "States it feels like a  knot "in her stomach.  sHe reports she has been taking metronidazole and doxycycline for recent bacterial vaginosis and suspected STDs.  Denies cough, sore throat, runny nose.  Denies fever or chills.  She was feeling well up until this morning.  Feeling much better this evening than she was earlier today.  No change in her urinary patterns.  Has not had her menstrual period yet for this month.     Past Medical History:  Diagnosis Date   Heart murmur     Patient Active Problem List   Diagnosis Date Noted   Bipolar disorder (HCC) 06/29/2017    History reviewed. No pertinent surgical history.  OB History   No obstetric history on file.      Home Medications    Prior to Admission medications   Medication Sig Start Date End Date Taking? Authorizing Provider  doxycycline (VIBRAMYCIN) 100 MG capsule Take 1 capsule (100 mg total) by mouth 2 (two) times daily for 7 days. 10/20/19 10/27/19  Katha Cabal, DO  metroNIDAZOLE (FLAGYL) 500 MG tablet Take 1 tablet (500 mg total) by mouth 2 (two) times daily for 7 days. 10/20/19 10/27/19  Brimage, Seward Meth, DO  ondansetron (ZOFRAN) 4 MG tablet Take 1 tablet (4 mg total) by mouth every 8 (eight) hours as needed for nausea or vomiting. 10/24/19   Baldomero Mirarchi, Veryl Speak, PA-C  promethazine (PHENERGAN) 12.5 MG tablet Take 1 tablet (12.5 mg total) by mouth every 6 (six) hours as needed. 08/30/19   Willy Eddy, MD    Family History Family History  Problem Relation Age of Onset   Hypertension Mother     Hypertension Father     Social History Social History   Tobacco Use   Smoking status: Never Smoker   Smokeless tobacco: Never Used  Building services engineer Use: Never used  Substance Use Topics   Alcohol use: Yes    Comment: rare wine   Drug use: Yes    Types: Marijuana    Comment: last use today     Allergies   Patient has no known allergies.   Review of Systems Review of Systems   Physical Exam Triage Vital Signs ED Triage Vitals  Enc Vitals Group     BP      Pulse      Resp      Temp      Temp src      SpO2      Weight      Height      Head Circumference      Peak Flow      Pain Score      Pain Loc      Pain Edu?      Excl. in GC?    No data found.  Updated Vital Signs BP 106/67 (BP Location: Left Arm)    Pulse 81    Temp 99.2 F (37.3 C) (Oral)    Resp 18  Ht 5\' 3"  (1.6 m)    Wt 229 lb 4.5 oz (104 kg)    LMP 09/29/2019    SpO2 100%    BMI 40.61 kg/m   Visual Acuity Right Eye Distance:   Left Eye Distance:   Bilateral Distance:    Right Eye Near:   Left Eye Near:    Bilateral Near:     Physical Exam Vitals and nursing note reviewed.  Constitutional:      General: She is not in acute distress.    Appearance: She is well-developed.  HENT:     Head: Normocephalic and atraumatic.  Eyes:     Conjunctiva/sclera: Conjunctivae normal.  Cardiovascular:     Rate and Rhythm: Normal rate and regular rhythm.     Heart sounds: No murmur heard.   Pulmonary:     Effort: Pulmonary effort is normal. No respiratory distress.     Breath sounds: Normal breath sounds.  Abdominal:     Palpations: Abdomen is soft.     Tenderness: There is no abdominal tenderness.  Musculoskeletal:     Cervical back: Neck supple.  Skin:    General: Skin is warm and dry.  Neurological:     Mental Status: She is alert.      UC Treatments / Results  Labs (all labs ordered are listed, but only abnormal results are displayed) Labs Reviewed  PREGNANCY, URINE     EKG   Radiology No results found.  Procedures Procedures (including critical care time)  Medications Ordered in UC Medications - No data to display  Initial Impression / Assessment and Plan / UC Course  I have reviewed the triage vital signs and the nursing notes.  Pertinent labs & imaging results that were available during my care of the patient were reviewed by me and considered in my medical decision making (see chart for details).     #Nausea Patient is 29 year old presenting with nausea.  Likely this could be related to medications.  Patient tested negative for chlamydia, recommend her stop the doxycycline.  Will send in some Zofran.  Recommend Pepcid as well.  Urine pregnancy remains negative.  Discussed return, follow-up and emergency for precautions.  Patient verbalized understanding plan of care Final Clinical Impressions(s) / UC Diagnoses   Final diagnoses:  Nausea     Discharge Instructions     Stop the doxycycline  Take pepcid over the couter Use the zofran only as needed  Eat small meals and hydrate well  Follow-up with your primary care tomorrow       ED Prescriptions    Medication Sig Dispense Auth. Provider   ondansetron (ZOFRAN) 4 MG tablet Take 1 tablet (4 mg total) by mouth every 8 (eight) hours as needed for nausea or vomiting. 4 tablet Hadassa Cermak, 27, PA-C     PDMP not reviewed this encounter.   Veryl Speak, PA-C 10/24/19 2036

## 2019-10-24 NOTE — ED Triage Notes (Addendum)
Pt states she is being treated for BV and questionable STDs. She has taken 2 days of antibiotics and woke up today with constant nausea all day. States she had negative strep, negative COVID, negative pregnancy while she was here on 9/2

## 2019-11-10 ENCOUNTER — Encounter: Payer: Self-pay | Admitting: Emergency Medicine

## 2019-11-10 ENCOUNTER — Ambulatory Visit
Admission: EM | Admit: 2019-11-10 | Discharge: 2019-11-10 | Disposition: A | Discharge status: Home / Self Care | Payer: BC Managed Care – PPO | Attending: Family Medicine | Admitting: Family Medicine

## 2019-11-10 ENCOUNTER — Other Ambulatory Visit: Payer: Self-pay

## 2019-11-10 DIAGNOSIS — F319 Bipolar disorder, unspecified: Secondary | ICD-10-CM | POA: Diagnosis not present

## 2019-11-10 DIAGNOSIS — Z20822 Contact with and (suspected) exposure to covid-19: Secondary | ICD-10-CM | POA: Diagnosis not present

## 2019-11-10 DIAGNOSIS — R519 Headache, unspecified: Secondary | ICD-10-CM | POA: Diagnosis present

## 2019-11-10 DIAGNOSIS — Z79899 Other long term (current) drug therapy: Secondary | ICD-10-CM | POA: Insufficient documentation

## 2019-11-10 MED ORDER — BUTALBITAL-APAP-CAFFEINE 50-325-40 MG PO TABS: 1.0000 | ORAL_TABLET | Freq: Four times a day (QID) | ORAL | 0 refills | Status: DC | PRN

## 2019-11-10 NOTE — ED Triage Notes (Addendum)
Patient states she has been having a headache that started this morning. 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

## 2019-11-10 NOTE — Discharge Instructions (Signed)
Medication as prescribed. ° °Rest. Fluids. ° °Take care ° °Dr. Valjean Ruppel  °

## 2019-11-11 LAB — SARS CORONAVIRUS 2 (TAT 6-24 HRS): SARS Coronavirus 2: NEGATIVE

## 2019-11-11 NOTE — ED Provider Notes (Signed)
[facility]    CSN: [id number] Arrival date & time: 11/10/19  1918      History   Chief Complaint Chief Complaint  Patient presents with  . Headache   HPI  29 year old female presents with headache.  Headache started this am. Right sided. Located in the temporal region. No nausea or vomiting. Mild photophobia. Pain 5/10 in severity. Described as achy. No relieving factors. No other complaints.    Past Medical History:  Diagnosis Date  . Heart murmur     Patient Active Problem List   Diagnosis Date Noted  . Bipolar disorder (HCC) 06/29/2017   Home Medications    Prior to Admission medications   Medication Sig Start Date End Date Taking? Authorizing Provider  butalbital-acetaminophen-caffeine (FIORICET) 50-325-40 MG tablet Take 1 tablet by mouth every 6 (six) hours as needed for headache or migraine. 11/10/19 11/09/20  Tommie Sams, DO  promethazine (PHENERGAN) 12.5 MG tablet Take 1 tablet (12.5 mg total) by mouth every 6 (six) hours as needed. 08/30/19 11/10/19  Willy Eddy, MD    Family History Family History  Problem Relation Age of Onset  . Hypertension Mother   . Hypertension Father     Social History Social History   Tobacco Use  . Smoking status: Never Smoker  . Smokeless tobacco: Never Used  Vaping Use  . Vaping Use: Never used  Substance Use Topics  . Alcohol use: Yes    Comment: rare wine  . Drug use: Yes    Types: Marijuana    Comment: last use today     Allergies   Patient has no known allergies.   Review of Systems Review of Systems  Eyes: Positive for photophobia.  Neurological: Positive for headaches.   Physical Exam Triage Vital Signs ED Triage Vitals  Enc Vitals Group     BP 11/10/19 1948 119/74     Pulse Rate 11/10/19 1948 67     Resp 11/10/19 1948 18     Temp 11/10/19 1948 99.7 F (37.6 C)     Temp Source 11/10/19 1948 Oral     SpO2 11/10/19 1948 100 %     Weight 11/10/19 1947 220 lb (99.8 kg)      Height 11/10/19 1947 5\' 3"  (1.6 m)     Head Circumference --      Peak Flow --      Pain Score 11/10/19 1946 5     Pain Loc --      Pain Edu? --      Excl. in GC? --    No data found.  Updated Vital Signs BP 119/74 (BP Location: Right Arm)   Pulse 67   Temp 99.7 F (37.6 C) (Oral)   Resp 18   Ht 5\' 3"  (1.6 m)   Wt 99.8 kg   LMP 10/27/2019   SpO2 100%   BMI 38.97 kg/m   Visual Acuity Right Eye Distance:   Left Eye Distance:   Bilateral Distance:    Right Eye Near:   Left Eye Near:    Bilateral Near:     Physical Exam Vitals and nursing note reviewed.  Constitutional:      General: She is not in acute distress.    Appearance: Normal appearance. She is not ill-appearing.  HENT:     Head: Normocephalic and atraumatic.  Eyes:     General:        Right eye: No discharge.  Left eye: No discharge.     Conjunctiva/sclera: Conjunctivae normal.     Pupils: Pupils are equal, round, and reactive to light.  Cardiovascular:     Rate and Rhythm: Normal rate and regular rhythm.     Heart sounds: No murmur heard.   Pulmonary:     Effort: Pulmonary effort is normal.     Breath sounds: Normal breath sounds. No wheezing, rhonchi or rales.  Neurological:     General: No focal deficit present.     Mental Status: She is alert.  Psychiatric:        Mood and Affect: Mood normal.        Behavior: Behavior normal.    UC Treatments / Results  Labs (all labs ordered are listed, but only abnormal results are displayed) Labs Reviewed  SARS CORONAVIRUS 2 (TAT 6-24 HRS)    EKG   Radiology No results found.  Procedures Procedures (including critical care time)  Medications Ordered in UC Medications - No data to display  Initial Impression / Assessment and Plan / UC Course  I have reviewed the triage vital signs and the nursing notes.  Pertinent labs & imaging results that were available during my care of the patient were reviewed by me and considered in my medical  decision making (see chart for details).    29 year old female presents with acute headache. Possible migraine. Treating with Fioricet.   Final Clinical Impressions(s) / UC Diagnoses   Final diagnoses:  Acute nonintractable headache, unspecified headache type     Discharge Instructions     Medication as prescribed.  Rest.  Fluids.  Take care  Dr. Adriana Simas    ED Prescriptions    Medication Sig Dispense Auth. Provider   butalbital-acetaminophen-caffeine (FIORICET) 50-325-40 MG tablet Take 1 tablet by mouth every 6 (six) hours as needed for headache or migraine. 20 tablet Tommie Sams, DO     PDMP not reviewed this encounter.   Tommie Sams, [place] 11/11/19 2144

## 2019-11-25 ENCOUNTER — Encounter: Payer: Self-pay | Admitting: Emergency Medicine

## 2019-11-25 ENCOUNTER — Other Ambulatory Visit: Payer: Self-pay

## 2019-11-25 ENCOUNTER — Ambulatory Visit
Admission: EM | Admit: 2019-11-25 | Discharge: 2019-11-25 | Disposition: A | Discharge status: Home / Self Care | Payer: BC Managed Care – PPO | Attending: Family Medicine | Admitting: Family Medicine

## 2019-11-25 DIAGNOSIS — M62830 Muscle spasm of back: Secondary | ICD-10-CM | POA: Diagnosis not present

## 2019-11-25 MED ORDER — CYCLOBENZAPRINE HCL 10 MG PO TABS: 10.0000 mg | ORAL_TABLET | Freq: Every evening | ORAL | 0 refills | Status: DC | PRN

## 2019-11-25 MED ORDER — KETOROLAC TROMETHAMINE 30 MG/ML IJ SOLN
30.0000 mg | Freq: Once | INTRAMUSCULAR | Status: AC
  Administered 2019-11-25: 30 mg via INTRAMUSCULAR

## 2019-11-25 NOTE — Discharge Instructions (Addendum)
Thank you for coming in to see [place] today! Please see below to review our plan for today's visit:  1. You received a Toradol shot for your back pain. I am also sending a prescription for Flexeril 5mg  to your pharmacy which you can take as needed once or twice daily for back spasms. Please know that this medication can make you very sleepy. 2.  Do not forget to do your neck and back stretches at least once daily, holding each position for at least 15 seconds.  Also use warm compresses and other therapeutic pads, such as Salonpas, at the site where you are having pain.  Remember, when you are having a muscular pain like this is the more important to keep your body moving and exercise!  This will help the area to heal faster. 3.  Good luck with all of your endeavors and keep working hard!  It was our pleasure to serve you!   Dr. [facility]

## 2019-11-25 NOTE — ED Triage Notes (Signed)
Patient c/o mid back pain that started this morning.  Patient denies injury or fall.

## 2019-11-25 NOTE — ED Provider Notes (Signed)
[facility]    CSN: [id number] Arrival date & time: 11/25/19  1845   History   Chief Complaint Chief Complaint  Patient presents with  . Back Pain    HPI Katherine Herring is a 29 y.o. female presenting for concern for upper back pain.  Patient reports that she woke up with the pain this morning, denies any trauma, falls, shortness of breath, chest pain.  She does, however, report that she has pain when she breathes in at the site of the muscle pain.  Denies any fevers, body aches, chills, rashes, or loss of bladder or bowel function.  HPI  Past Medical History:  Diagnosis Date  . Heart murmur     Patient Active Problem List   Diagnosis Date Noted  . Muscle spasm of back 11/25/2019  . Bipolar disorder (HCC) 06/29/2017    History reviewed. No pertinent surgical history.  OB History   No obstetric history on file.     Home Medications    Prior to Admission medications   Medication Sig Start Date End Date Taking? Authorizing Provider  butalbital-acetaminophen-caffeine (FIORICET) 50-325-40 MG tablet Take 1 tablet by mouth every 6 (six) hours as needed for headache or migraine. 11/10/19 11/09/20  Tommie Sams, DO  cyclobenzaprine (FLEXERIL) 10 MG tablet Take 1 tablet (10 mg total) by mouth at bedtime as needed for muscle spasms. 11/25/19   Dollene Cleveland, DO  promethazine (PHENERGAN) 12.5 MG tablet Take 1 tablet (12.5 mg total) by mouth every 6 (six) hours as needed. 08/30/19 11/10/19  Willy Eddy, MD    Family History Family History  Problem Relation Age of Onset  . Hypertension Mother   . Hypertension Father     Social History Social History   Tobacco Use  . Smoking status: Never Smoker  . Smokeless tobacco: Never Used  Vaping Use  . Vaping Use: Never used  Substance Use Topics  . Alcohol use: Yes    Comment: rare wine  . Drug use: Yes    Types: Marijuana    Comment: last use today     Allergies   Patient has no known  allergies.   Review of Systems Review of Systems - see HPI   Physical Exam Triage Vital Signs ED Triage Vitals  Enc Vitals Group     BP      Pulse      Resp      Temp      Temp src      SpO2      Weight      Height      Head Circumference      Peak Flow      Pain Score      Pain Loc      Pain Edu?      Excl. in GC?    No data found.  Updated Vital Signs BP (!) 117/58 (BP Location: Left Arm)   Pulse 64   Temp 98.8 F (37.1 C) (Oral)   Resp 14   Ht 5\' 3"  (1.6 m)   Wt 220 lb (99.8 kg)   LMP 11/21/2019 (Exact Date)   SpO2 100%   BMI 38.97 kg/m    Physical exam: General: well appearing patient, very pleasant Respiratory: comfortable work of breathing, speaking in complete sentences MSK: hypertonicity appreciated to paravertebral spinal musculature around rib head of 5th and 6th ribs without any ecchymosis with tenderness and hypertonicity extending caudad  UC Treatments / Results  Labs (  all labs ordered are listed, but only abnormal results are displayed) Labs Reviewed - No data to display  EKG   Radiology No results found.  Procedures Procedures (including critical care time)  Medications Ordered in UC Medications  ketorolac (TORADOL) 30 MG/ML injection 30 mg (30 mg Intramuscular Given 11/25/19 1928)    Initial Impression / Assessment and Plan / UC Course  I have reviewed the triage vital signs and the nursing notes.  Pertinent labs & imaging results that were available during my care of the patient were reviewed by me and considered in my medical decision making (see chart for details).    Back pain, upper left: Patient with back pain MSK in nature.  No focal neurological deficits appreciated -Instructed on neck and back stretches to help reduce pain -Instructed to use heating pad and other topical such as Salonpas to help with pain -Received Toradol shot here in the clinic -Given prescription for Flexeril 10 mg by mouth at night as needed for  muscle spasms   Final Clinical Impressions(s) / UC Diagnoses   Final diagnoses:  Muscle spasm of back     Discharge Instructions     Thank you for coming in to see [place] today! Please see below to review our plan for today's visit:  1. You received a Toradol shot for your back pain. I am also sending a prescription for Flexeril 5mg  to your pharmacy which you can take as needed once or twice daily for back spasms. Please know that this medication can make you very sleepy. 2.  Do not forget to do your neck and back stretches at least once daily, holding each position for at least 15 seconds.  Also use warm compresses and other therapeutic pads, such as Salonpas, at the site where you are having pain.  Remember, when you are having a muscular pain like this is the more important to keep your body moving and exercise!  This will help the area to heal faster. 3.  Good luck with all of your endeavors and keep working hard!  It was our pleasure to serve you!   Dr. [facility]     ED Prescriptions    Medication Sig Dispense Auth. Provider   cyclobenzaprine (FLEXERIL) 10 MG tablet Take 1 tablet (10 mg total) by mouth at bedtime as needed for muscle spasms. 10 tablet [facility], DO     PDMP not reviewed this encounter.   Dollene Cleveland, DO [facility], PGY-3 11/25/2019 7:49 PM    01/25/2020, DO 11/25/19 1950

## 2019-12-13 ENCOUNTER — Other Ambulatory Visit: Payer: Self-pay

## 2019-12-13 ENCOUNTER — Encounter: Payer: Self-pay | Admitting: Emergency Medicine

## 2019-12-13 ENCOUNTER — Ambulatory Visit
Admission: EM | Admit: 2019-12-13 | Discharge: 2019-12-13 | Disposition: A | Discharge status: Home / Self Care | Payer: BC Managed Care – PPO | Attending: Family Medicine | Admitting: Family Medicine

## 2019-12-13 DIAGNOSIS — R5383 Other fatigue: Secondary | ICD-10-CM | POA: Insufficient documentation

## 2019-12-13 DIAGNOSIS — R52 Pain, unspecified: Secondary | ICD-10-CM | POA: Diagnosis present

## 2019-12-13 DIAGNOSIS — Z20822 Contact with and (suspected) exposure to covid-19: Secondary | ICD-10-CM | POA: Insufficient documentation

## 2019-12-13 DIAGNOSIS — R0989 Other specified symptoms and signs involving the circulatory and respiratory systems: Secondary | ICD-10-CM | POA: Insufficient documentation

## 2019-12-13 LAB — GROUP A STREP BY PCR: Group A Strep by PCR: NOT DETECTED

## 2019-12-13 LAB — RAPID INFLUENZA A&B ANTIGENS
Influenza A (ARMC): NEGATIVE
Influenza B (ARMC): NEGATIVE

## 2019-12-13 MED ORDER — KETOROLAC TROMETHAMINE 10 MG PO TABS: 10.0000 mg | ORAL_TABLET | Freq: Four times a day (QID) | ORAL | 0 refills | Status: DC | PRN

## 2019-12-13 NOTE — ED Provider Notes (Signed)
[facility]    CSN: [id number] Arrival date & time: 12/13/19  1948  History   Chief Complaint Chief Complaint  Patient presents with  . Generalized Body Aches   HPI  29 year old female presents with generalized body aches.  Patient reports generalized body aches particularly at the upper extremities.  Started today.  She notes fatigue.  Reported scratchy throat to the nursing staff but did not report sore throat to me.  Pain 5/10 in severity.  No relieving factors.  No documented fever.  No other reported symptoms.  No other complaints.  Past Medical History:  Diagnosis Date  . Heart murmur    Patient Active Problem List   Diagnosis Date Noted  . Muscle spasm of back 11/25/2019  . Bipolar disorder (HCC) 06/29/2017   Home Medications    Prior to Admission medications   Medication Sig Start Date End Date Taking? Authorizing Provider  ketorolac (TORADOL) 10 MG tablet Take 1 tablet (10 mg total) by mouth every 6 (six) hours as needed for moderate pain or severe pain. 12/13/19   Tommie Sams, DO  promethazine (PHENERGAN) 12.5 MG tablet Take 1 tablet (12.5 mg total) by mouth every 6 (six) hours as needed. 08/30/19 11/10/19  Willy Eddy, MD    Family History Family History  Problem Relation Age of Onset  . Hypertension Mother   . Hypertension Father     Social History Social History   Tobacco Use  . Smoking status: Never Smoker  . Smokeless tobacco: Never Used  Vaping Use  . Vaping Use: Never used  Substance Use Topics  . Alcohol use: Yes    Comment: rare wine  . Drug use: Yes    Types: Marijuana    Comment: last use today     Allergies   Patient has no known allergies.   Review of Systems Review of Systems  Constitutional: Positive for fatigue. Negative for fever.  Musculoskeletal:       Body aches.   Physical Exam Triage Vital Signs ED Triage Vitals  Enc Vitals Group     BP 12/13/19 2004 105/66     Pulse Rate 12/13/19 2004 84      Resp 12/13/19 2004 18     Temp 12/13/19 2004 98.8 F (37.1 C)     Temp Source 12/13/19 2004 Oral     SpO2 12/13/19 2004 99 %     Weight 12/13/19 2001 217 lb 13 oz (98.8 kg)     Height 12/13/19 2001 5\' 3"  (1.6 m)     Head Circumference --      Peak Flow --      Pain Score 12/13/19 2000 5     Pain Loc --      Pain Edu? --      Excl. in GC? --    Updated Vital Signs BP 105/66 (BP Location: Left Arm)   Pulse 84   Temp 98.8 F (37.1 C) (Oral)   Resp 18   Ht 5\' 3"  (1.6 m)   Wt 98.8 kg   LMP 11/21/2019 (Exact Date)   SpO2 99%   BMI 38.58 kg/m   Visual Acuity Right Eye Distance:   Left Eye Distance:   Bilateral Distance:    Right Eye Near:   Left Eye Near:    Bilateral Near:     Physical Exam Vitals and nursing note reviewed.  Constitutional:      General: She is not in acute distress.    Appearance: Normal  appearance. She is obese. She is not ill-appearing.  HENT:     Head: Normocephalic and atraumatic.     Mouth/Throat:     Pharynx: Oropharynx is clear. No oropharyngeal exudate or posterior oropharyngeal erythema.  Cardiovascular:     Rate and Rhythm: Normal rate and regular rhythm.     Heart sounds: No murmur heard.   Pulmonary:     Effort: Pulmonary effort is normal.     Breath sounds: Normal breath sounds. No wheezing or rales.  Neurological:     Mental Status: She is alert.    UC Treatments / Results  Labs (all labs ordered are listed, but only abnormal results are displayed) Labs Reviewed  RAPID INFLUENZA A&B ANTIGENS  GROUP A STREP BY PCR  SARS CORONAVIRUS 2 (TAT 6-24 HRS)    EKG   Radiology No results found.  Procedures Procedures (including critical care time)  Medications Ordered in UC Medications - No data to display  Initial Impression / Assessment and Plan / UC Course  I have reviewed the triage vital signs and the nursing notes.  Pertinent labs & imaging results that were available during my care of the patient were reviewed by me  and considered in my medical decision making (see chart for details).    29 year old female presents with body aches.  Well-appearing on exam.  Awaiting Covid test result.  Patient requested strep testing as well as flu testing which were both negative.  Toradol as needed for body aches.  Supportive care.  Final Clinical Impressions(s) / UC Diagnoses   Final diagnoses:  Body aches     Discharge Instructions     We will call if results are positive.  Medication as prescribed.  Take care  Dr. Adriana Simas    ED Prescriptions    Medication Sig Dispense Auth. Provider   ketorolac (TORADOL) 10 MG tablet Take 1 tablet (10 mg total) by mouth every 6 (six) hours as needed for moderate pain or severe pain. 20 tablet Tommie Sams, DO     PDMP not reviewed this encounter.   Tommie Sams, [place] 12/13/19 2307

## 2019-12-13 NOTE — ED Triage Notes (Signed)
Pt c/o body aches, fatigue, scratchy throat. Started this morning. Denies fever.

## 2019-12-13 NOTE — Discharge Instructions (Signed)
We will call if results are positive.  Medication as prescribed.  Take care  Dr. Adriana Simas

## 2019-12-14 LAB — SARS CORONAVIRUS 2 (TAT 6-24 HRS): SARS Coronavirus 2: NEGATIVE

## 2020-01-02 ENCOUNTER — Ambulatory Visit
Admission: EM | Admit: 2020-01-02 | Discharge: 2020-01-02 | Disposition: A | Discharge status: Home / Self Care | Payer: BC Managed Care – PPO | Attending: Family Medicine | Admitting: Family Medicine

## 2020-01-02 ENCOUNTER — Ambulatory Visit (INDEPENDENT_AMBULATORY_CARE_PROVIDER_SITE_OTHER): Payer: BC Managed Care – PPO

## 2020-01-02 ENCOUNTER — Other Ambulatory Visit: Payer: Self-pay

## 2020-01-02 DIAGNOSIS — R059 Cough, unspecified: Secondary | ICD-10-CM

## 2020-01-02 DIAGNOSIS — Z20822 Contact with and (suspected) exposure to covid-19: Secondary | ICD-10-CM | POA: Diagnosis not present

## 2020-01-02 DIAGNOSIS — R0602 Shortness of breath: Secondary | ICD-10-CM | POA: Insufficient documentation

## 2020-01-02 LAB — SARS CORONAVIRUS 2 (TAT 6-24 HRS): SARS Coronavirus 2: NEGATIVE

## 2020-01-02 MED ORDER — PROMETHAZINE-DM 6.25-15 MG/5ML PO SYRP: 5.0000 mL | ORAL_SOLUTION | Freq: Four times a day (QID) | ORAL | 0 refills | Status: DC | PRN

## 2020-01-02 MED ORDER — BENZONATATE 100 MG PO CAPS: 200.0000 mg | ORAL_CAPSULE | Freq: Three times a day (TID) | ORAL | 0 refills | Status: DC

## 2020-01-02 NOTE — ED Triage Notes (Signed)
Pt presents to Urgent Care with c/o productive cough x "a few days." Pt reports sputum is greenish w/ "brown specks." Pt w/ no known COVID exposure. Pt has not been vaccinated against COVID.

## 2020-01-02 NOTE — Discharge Instructions (Addendum)
Use the Tessalon Perles every 8 hours as needed for cough during the day.  Take them with a small sip of water.  Use the Promethazine DM at bedtime for cough and sleep.  Do not take that with the Tylenol Cold and flu.  Increase your oral fluid intake to keep your mucus nice and thin.  You can perform sinus irrigation 2-3 times a day with distilled water and a NeilMed sinus irrigation kit.  If your symptoms continue follow-up with your primary care provider.

## 2020-01-02 NOTE — ED Provider Notes (Signed)
[facility]    CSN: [id number] Arrival date & time: 01/02/20  0948      History   Chief Complaint Chief Complaint  Patient presents with  . Cough    HPI Katherine Herring is a 29 y.o. female.   29 year old female here for evaluation of productive cough.  Patient reports that she has been experiencing a cough, wheezing, slightly runny nose, and a productive cough for a green to brown sputum.  Patient denies fever, chills, body aches, sinus pain or pressure, or sick contacts.  Patient denies shortness of breath.  Patient has not been vaccinated against Covid but she has received her flu vaccine.     Past Medical History:  Diagnosis Date  . Heart murmur     Patient Active Problem List   Diagnosis Date Noted  . Muscle spasm of back 11/25/2019  . Bipolar disorder ([place]) 06/29/2017    History reviewed. No pertinent surgical history.  OB History   No obstetric history on file.      Home Medications    Prior to Admission medications   Medication Sig Start Date End Date Taking? Authorizing Provider  Pseudoephedrine-APAP-DM (TYLENOL COLD/FLU DAY PO) Take 1 tablet by mouth 4 (four) times daily as needed.   Yes [provider]  benzonatate (TESSALON) 100 MG capsule Take 2 capsules (200 mg total) by mouth every 8 (eight) hours. 01/02/20   Margarette Canada, NP  ketorolac (TORADOL) 10 MG tablet Take 1 tablet (10 mg total) by mouth every 6 (six) hours as needed for moderate pain or severe pain. 12/13/19   Coral Spikes, DO  promethazine-dextromethorphan (PROMETHAZINE-DM) 6.25-15 MG/5ML syrup Take 5 mLs by mouth 4 (four) times daily as needed. 01/02/20   Margarette Canada, NP  promethazine (PHENERGAN) 12.5 MG tablet Take 1 tablet (12.5 mg total) by mouth every 6 (six) hours as needed. 08/30/19 11/10/19  Merlyn Lot, MD    Family History Family History  Problem Relation Age of Onset  . Hypertension Mother   . Hypertension Father     Social  History Social History   Tobacco Use  . Smoking status: Never Smoker  . Smokeless tobacco: Never Used  Vaping Use  . Vaping Use: Never used  Substance Use Topics  . Alcohol use: Yes    Comment: 1 bottle q weekend  . Drug use: Yes    Types: Marijuana    Comment: last use today     Allergies   Patient has no known allergies.   Review of Systems Review of Systems  Constitutional: Negative for activity change, appetite change and fever.  HENT: Positive for congestion and rhinorrhea. Negative for ear pain, sinus pressure and sore throat.   Respiratory: Positive for cough and wheezing. Negative for shortness of breath.   Cardiovascular: Negative for chest pain.  Gastrointestinal: Negative for diarrhea, nausea and vomiting.  Musculoskeletal: Negative for arthralgias and myalgias.  Skin: Negative for rash.  Neurological: Negative for syncope and headaches.  Hematological: Negative.   Psychiatric/Behavioral: Negative.      Physical Exam Triage Vital Signs ED Triage Vitals  Enc Vitals Group     BP 01/02/20 1100 117/76     Pulse Rate 01/02/20 1100 70     Resp 01/02/20 1100 18     Temp 01/02/20 1100 98.8 F (37.1 C)     Temp Source 01/02/20 1100 Oral     SpO2 01/02/20 1100 100 %     Weight 01/02/20 1054 225 lb (102.1 kg)  Height 01/02/20 1054 _0  (1.6 m)     Head Circumference --      Peak Flow --      Pain Score 01/02/20 1054 0     Pain Loc --      Pain Edu? --      Excl. in [place]? --    No data found.  Updated Vital Signs BP 117/76 (BP Location: Left Arm)   Pulse 70   Temp 98.8 F (37.1 C) (Oral)   Resp 18   Ht _1  (1.6 m)   Wt 225 lb (102.1 kg)   LMP 12/26/2019 (Approximate)   SpO2 100%   BMI 39.86 kg/m   Visual Acuity Right Eye Distance:   Left Eye Distance:   Bilateral Distance:    Right Eye Near:   Left Eye Near:    Bilateral Near:     Physical Exam Vitals and nursing note reviewed.  Constitutional:      General: She is not in acute  distress.    Appearance: Normal appearance. She is not toxic-appearing.  HENT:     Head: Normocephalic and atraumatic.     Right Ear: Tympanic membrane, ear canal and external ear normal. There is no impacted cerumen.     Left Ear: Tympanic membrane, ear canal and external ear normal. There is no impacted cerumen.     Nose: Congestion and rhinorrhea present.     Comments: Nasal mucosa is erythematous and edematous with clear nasal discharge.    Mouth/Throat:     Mouth: Mucous membranes are moist.     Pharynx: Oropharynx is clear. No oropharyngeal exudate or posterior oropharyngeal erythema.     Comments: Posterior oropharynx is nonerythematous or edematous and no postnasal drip noted. Eyes:     General: No scleral icterus.    Extraocular Movements: Extraocular movements intact.     Conjunctiva/sclera: Conjunctivae normal.     Pupils: Pupils are equal, round, and reactive to light.  Cardiovascular:     Rate and Rhythm: Normal rate and regular rhythm.     Pulses: Normal pulses.     Heart sounds: Normal heart sounds. No murmur heard.  No gallop.   Pulmonary:     Effort: Pulmonary effort is normal.     Breath sounds: No wheezing, rhonchi or rales.     Comments: Patient is decreased lung sounds in all fields. Musculoskeletal:        General: No swelling or tenderness. Normal range of motion.     Cervical back: Normal range of motion and neck supple.  Lymphadenopathy:     Cervical: No cervical adenopathy.  Skin:    General: Skin is warm.     Capillary Refill: Capillary refill takes less than 2 seconds.     Findings: No erythema or rash.  Neurological:     General: No focal deficit present.     Mental Status: She is alert and oriented to person, place, and time.  Psychiatric:        Mood and Affect: Mood normal.        Behavior: Behavior normal.        Thought Content: Thought content normal.        Judgment: Judgment normal.      UC Treatments / Results  Labs (all labs  ordered are listed, but only abnormal results are displayed) Labs Reviewed  SARS CORONAVIRUS 2 (TAT 6-24 HRS)    EKG   Radiology DG Chest 2 View  Result Date: 01/02/2020  CLINICAL DATA:  Productive cough and shortness of breath. EXAM: CHEST - 2 VIEW COMPARISON:  None. FINDINGS: Heart size is normal. Mediastinal shadows are normal. The lungs are clear. No bronchial thickening. No infiltrate, mass, effusion or collapse. Pulmonary vascularity is normal. No bony abnormality. IMPRESSION: Normal chest Electronically Signed   By: Nelson Chimes M.D.   On: 01/02/2020 11:32    Procedures Procedures (including critical care time)  Medications Ordered in UC Medications - No data to display  Initial Impression / Assessment and Plan / UC Course  I have reviewed the triage vital signs and the nursing notes.  Pertinent labs & imaging results that were available during my care of the patient were reviewed by me and considered in my medical decision making (see chart for details).   Patient is here for evaluation of a productive cough for a green-brown sputum for the past few days.  She reports that she had some wheezing but no shortness of breath.  She has not had a fever.  Patient has no URI complaints.  Nasal mucosa is erythematous and edematous but there is clear nasal discharge no postnasal drip noted in the posterior oropharynx.  Lung sounds are diminished in all fields.  Will obtain chest x-ray.  Chest x-ray is negative for pneumonia or other intrathoracic process.   Final Clinical Impressions(s) / UC Diagnoses   Final diagnoses:  Cough     Discharge Instructions     Use the Tessalon Perles every 8 hours as needed for cough during the day.  Take them with a small sip of water.  Use the Promethazine DM at bedtime for cough and sleep.  Do not take that with the Tylenol Cold and flu.  Increase your oral fluid intake to keep your mucus nice and thin.  You can perform sinus irrigation  2-3 times a day with distilled water and a NeilMed sinus irrigation kit.  If your symptoms continue follow-up with your primary care provider.    ED Prescriptions    Medication Sig Dispense Auth. Provider   benzonatate (TESSALON) 100 MG capsule Take 2 capsules (200 mg total) by mouth every 8 (eight) hours. 21 capsule Margarette Canada, NP   promethazine-dextromethorphan (PROMETHAZINE-DM) 6.25-15 MG/5ML syrup Take 5 mLs by mouth 4 (four) times daily as needed. 118 mL Margarette Canada, NP     PDMP not reviewed this encounter.   Margarette Canada, NP 01/02/20 1229

## 2020-01-06 ENCOUNTER — Other Ambulatory Visit: Payer: Self-pay

## 2020-01-06 ENCOUNTER — Ambulatory Visit
Admission: EM | Admit: 2020-01-06 | Discharge: 2020-01-06 | Disposition: A | Discharge status: Home / Self Care | Payer: BC Managed Care – PPO | Attending: Family Medicine | Admitting: Family Medicine

## 2020-01-21 ENCOUNTER — Other Ambulatory Visit: Payer: Self-pay

## 2020-01-21 ENCOUNTER — Ambulatory Visit
Admission: EM | Admit: 2020-01-21 | Discharge: 2020-01-21 | Disposition: A | Discharge status: Home / Self Care | Payer: BC Managed Care – PPO | Attending: Family Medicine | Admitting: Family Medicine

## 2020-01-21 DIAGNOSIS — R002 Palpitations: Secondary | ICD-10-CM

## 2020-01-21 DIAGNOSIS — R0602 Shortness of breath: Secondary | ICD-10-CM

## 2020-01-21 DIAGNOSIS — R0789 Other chest pain: Secondary | ICD-10-CM
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
## 2020-01-21 NOTE — Discharge Instructions (Addendum)
EKG looks fine.  No need to be concerned at this time.  Follow up with cardiology.  Take care  Dr. Adriana Simas

## 2020-02-09 ENCOUNTER — Emergency Department
Admission: EM | Admit: 2020-02-09 | Discharge: 2020-02-09 | Disposition: A | Discharge status: Home / Self Care | Payer: 59 | Attending: Emergency Medicine | Admitting: Emergency Medicine

## 2020-02-09 ENCOUNTER — Encounter: Payer: Self-pay | Admitting: Emergency Medicine

## 2020-02-09 ENCOUNTER — Other Ambulatory Visit: Payer: Self-pay

## 2020-02-09 DIAGNOSIS — R059 Cough, unspecified: Secondary | ICD-10-CM | POA: Diagnosis present

## 2020-02-09 DIAGNOSIS — U071 COVID-19: Secondary | ICD-10-CM

## 2020-02-09 LAB — RESP PANEL BY RT-PCR (FLU A&B, COVID) ARPGX2
Influenza A by PCR: NEGATIVE
Influenza B by PCR: NEGATIVE
SARS Coronavirus 2 by RT PCR: POSITIVE — AB

## 2020-02-09 MED ORDER — ACETAMINOPHEN 325 MG PO TABS
650.0000 mg | ORAL_TABLET | Freq: Once | ORAL | Status: AC | PRN
  Administered 2020-02-09: 650 mg via ORAL
  Filled 2020-02-09: qty 2

## 2020-02-09 MED ORDER — IBUPROFEN 600 MG PO TABS
600.0000 mg | ORAL_TABLET | Freq: Once | ORAL | Status: AC
  Administered 2020-02-09: 600 mg via ORAL
  Filled 2020-02-09: qty 1

## 2020-02-09 NOTE — ED Triage Notes (Signed)
Pt reports sudden onset of bodyaches and cough last pm. Pt states unsure of fevers.

## 2020-02-09 NOTE — Discharge Instructions (Signed)
Follow-up with your regular doctor as needed Return to emergency department worsening Take over-the-counter Mucinex for cough, vitamin C, zinc, and vitamin D to help with Covid symptoms. If you want the Covid monoclonal antibodies, the Covid infusion center's phone number is attached to your paperwork. You will need to quarantine for 14 days.  Take Tylenol for fever

## 2020-02-09 NOTE — ED Provider Notes (Signed)
[facility] Emergency Department Provider Note  ____________________________________________   Event Date/Time   First MD Initiated Contact with Patient 02/09/20 1243     (approximate)  I have reviewed the triage vital signs and the nursing notes.   HISTORY  Chief Complaint Generalized Body Aches and Cough    HPI Katherine Herring is a 29 y.o. female presents emergency department complaining of sudden onset of body aches and cough since last night with episodes of diarrhea.  Patient states that she works at [employer].  She started there last month.  States she did get a flu shot does not have her Covid vaccine.  She denies any chest pain or shortness of breath.  No vomiting.    Past Medical History:  Diagnosis Date   Heart murmur     Patient Active Problem List   Diagnosis Date Noted   Muscle spasm of back 11/25/2019   Bipolar disorder (HCC) 06/29/2017    History reviewed. No pertinent surgical history.  Prior to Admission medications   Medication Sig Start Date End Date Taking? Authorizing Provider  promethazine (PHENERGAN) 12.5 MG tablet Take 1 tablet (12.5 mg total) by mouth every 6 (six) hours as needed. 08/30/19 11/10/19  Willy Eddy, MD    Allergies Tuberculin  Family History  Problem Relation Age of Onset   Hypertension Mother    Hypertension Father     Social History Social History   Tobacco Use   Smoking status: Never Smoker   Smokeless tobacco: Never Used  Building services engineer Use: Never used  Substance Use Topics   Alcohol use: Yes    Comment: 1 bottle q weekend   Drug use: Yes    Types: Marijuana    Comment: last use today    Review of Systems  Constitutional: Positive fever/chills Eyes: No visual changes. ENT: No sore throat. Respiratory: Positive cough Cardiovascular: Denies chest pain Gastrointestinal: Denies abdominal pain, positive for diarrhea Genitourinary: Negative for  dysuria. Musculoskeletal: Negative for back pain. Skin: Negative for rash. Psychiatric: no mood changes,     ____________________________________________   PHYSICAL EXAM:  VITAL SIGNS: ED Triage Vitals  Enc Vitals Group     BP 02/09/20 1148 (!) 129/54     Pulse Rate 02/09/20 1148 95     Resp 02/09/20 1148 19     Temp 02/09/20 1148 (!) 101.9 F (38.8 C)     Temp Source 02/09/20 1148 Oral     SpO2 02/09/20 1148 100 %     Weight 02/09/20 1112 235 lb (106.6 kg)     Height 02/09/20 1112 5\' 3"  (1.6 m)     Head Circumference --      Peak Flow --      Pain Score 02/09/20 1112 8     Pain Loc --      Pain Edu? --      Excl. in GC? --     Constitutional: Alert and oriented. Well appearing and in no acute distress. Eyes: Conjunctivae are normal.  Head: Atraumatic. Nose: No congestion/rhinnorhea. Mouth/Throat: Mucous membranes are moist.   Neck:  supple no lymphadenopathy noted Cardiovascular: Normal rate, regular rhythm. Heart sounds are normal Respiratory: Normal respiratory effort.  No retractions, lungs c t a  Abd: soft nontender bs normal all 4 quad GU: deferred Musculoskeletal: FROM all extremities, warm and well perfused Neurologic:  Normal speech and language.  Skin:  Skin is warm, dry and intact. No rash noted. Psychiatric: Mood and  affect are normal. Speech and behavior are normal.  ____________________________________________   LABS (all labs ordered are listed, but only abnormal results are displayed)  Labs Reviewed  RESP PANEL BY RT-PCR (FLU A&B, COVID) ARPGX2 - Abnormal; Notable for the following components:      Result Value   SARS Coronavirus 2 by RT PCR POSITIVE (*)    All other components within normal limits   ____________________________________________   ____________________________________________  RADIOLOGY    ____________________________________________   PROCEDURES  Procedure(s) performed:  No  Procedures    ____________________________________________   INITIAL IMPRESSION / ASSESSMENT AND PLAN / ED COURSE  Pertinent labs & imaging results that were available during my care of the patient were reviewed by me and considered in my medical decision making (see chart for details).   Patient is 29 year old female presents with Covid-like symptoms.  See HPI.  Physical exam shows her to be febrile, the remainder of exam is unremarkable  Covid/flu test obtained   Covid test positive, flu test negative  Did explain findings to the patient.  She is to quarantine for 14 days.  Follow-up with your regular doctor as needed.  Follow-up with [facility] clinic if she desires monoclonal antibodies.  Phone number was given to the patient.  Take over-the-counter vitamin C, vitamin D, zinc and Mucinex.  Tylenol or ibuprofen for fever.  Return if worsening.  She was discharged in stable condition.  Katherine Herring was evaluated in Emergency Department on 02/09/2020 for the symptoms described in the history of present illness. She was evaluated in the context of the global COVID-19 pandemic, which necessitated consideration that the patient might be at risk for infection with the SARS-CoV-2 virus that causes COVID-19. Institutional protocols and algorithms that pertain to the evaluation of patients at risk for COVID-19 are in a state of rapid change based on information released by regulatory bodies including the CDC and federal and state organizations. These policies and algorithms were followed during the patient's care in the ED.    As part of my medical decision making, I reviewed the following data within the electronic MEDICAL RECORD NUMBER Nursing notes reviewed and incorporated, Labs reviewed , Old chart reviewed, Notes from prior ED visits and [place] Controlled Substance Database  ____________________________________________   FINAL CLINICAL IMPRESSION(S) / ED DIAGNOSES  Final diagnoses:   COVID-19      NEW MEDICATIONS STARTED DURING THIS VISIT:  There are no discharge medications for this patient.    Note:  This document was prepared using Dragon voice recognition software and may include unintentional dictation errors.    Faythe Ghee, PA-C 02/09/20 1448    Shaune Pollack, MD 02/10/20 [phone]

## 2020-02-10 DIAGNOSIS — E860 Dehydration: Secondary | ICD-10-CM | POA: Diagnosis not present

## 2020-02-10 DIAGNOSIS — U071 COVID-19: Secondary | ICD-10-CM | POA: Diagnosis not present

## 2020-02-10 DIAGNOSIS — K529 Noninfective gastroenteritis and colitis, unspecified: Secondary | ICD-10-CM | POA: Diagnosis not present

## 2020-02-10 DIAGNOSIS — N3000 Acute cystitis without hematuria: Secondary | ICD-10-CM | POA: Diagnosis not present

## 2020-02-10 DIAGNOSIS — E876 Hypokalemia: Secondary | ICD-10-CM | POA: Diagnosis not present

## 2020-02-10 DIAGNOSIS — R109 Unspecified abdominal pain: Secondary | ICD-10-CM | POA: Diagnosis present

## 2020-02-11 ENCOUNTER — Emergency Department
Admission: EM | Admit: 2020-02-11 | Discharge: 2020-02-11 | Disposition: A | Discharge status: Home / Self Care | Payer: 59 | Attending: Emergency Medicine | Admitting: Emergency Medicine

## 2020-02-11 DIAGNOSIS — K529 Noninfective gastroenteritis and colitis, unspecified: Secondary | ICD-10-CM

## 2020-02-11 DIAGNOSIS — E860 Dehydration: Secondary | ICD-10-CM

## 2020-02-11 DIAGNOSIS — N3090 Cystitis, unspecified without hematuria: Secondary | ICD-10-CM

## 2020-02-11 DIAGNOSIS — E876 Hypokalemia: Secondary | ICD-10-CM

## 2020-02-11 DIAGNOSIS — U071 COVID-19: Secondary | ICD-10-CM

## 2020-02-11 LAB — CBC WITH DIFFERENTIAL/PLATELET
Abs Immature Granulocytes: 0.01 10*3/uL (ref 0.00–0.07)
Basophils Absolute: 0 10*3/uL (ref 0.0–0.1)
Basophils Relative: 0 %
Eosinophils Absolute: 0 10*3/uL (ref 0.0–0.5)
Eosinophils Relative: 0 %
HCT: 40.3 % (ref 36.0–46.0)
Hemoglobin: 13.1 g/dL (ref 12.0–15.0)
Immature Granulocytes: 0 %
Lymphocytes Relative: 33 %
Lymphs Abs: 1.2 10*3/uL (ref 0.7–4.0)
MCH: 28.9 pg (ref 26.0–34.0)
MCHC: 32.5 g/dL (ref 30.0–36.0)
MCV: 88.8 fL (ref 80.0–100.0)
Monocytes Absolute: 0.6 10*3/uL (ref 0.1–1.0)
Monocytes Relative: 16 %
Neutro Abs: 1.9 10*3/uL (ref 1.7–7.7)
Neutrophils Relative %: 51 %
Platelets: 190 10*3/uL (ref 150–400)
RBC: 4.54 MIL/uL (ref 3.87–5.11)
RDW: 14 % (ref 11.5–15.5)
WBC: 3.7 10*3/uL — ABNORMAL LOW (ref 4.0–10.5)
nRBC: 0 % (ref 0.0–0.2)

## 2020-02-11 LAB — COMPREHENSIVE METABOLIC PANEL
ALT: 12 U/L (ref 0–44)
AST: 23 U/L (ref 15–41)
Albumin: 3.4 g/dL — ABNORMAL LOW (ref 3.5–5.0)
Alkaline Phosphatase: 40 U/L (ref 38–126)
Anion gap: 9 (ref 5–15)
BUN: 7 mg/dL (ref 6–20)
CO2: 23 mmol/L (ref 22–32)
Calcium: 8.2 mg/dL — ABNORMAL LOW (ref 8.9–10.3)
Chloride: 104 mmol/L (ref 98–111)
Creatinine, Ser: 0.65 mg/dL (ref 0.44–1.00)
GFR, Estimated: >60 mL/min (ref >60)
Glucose, Bld: 119 mg/dL — ABNORMAL HIGH (ref 70–99)
Potassium: 3 mmol/L — ABNORMAL LOW (ref 3.5–5.1)
Sodium: 136 mmol/L (ref 135–145)
Total Bilirubin: 0.4 mg/dL (ref 0.3–1.2)
Total Protein: 6.7 g/dL (ref 6.5–8.1)

## 2020-02-11 LAB — URINALYSIS, COMPLETE (UACMP) WITH MICROSCOPIC
Bilirubin Urine: NEGATIVE
Glucose, UA: NEGATIVE mg/dL
Hgb urine dipstick: NEGATIVE
Ketones, ur: 5 mg/dL — AB
Leukocytes,Ua: NEGATIVE
Nitrite: NEGATIVE
Protein, ur: >=300 mg/dL — AB
Specific Gravity, Urine: 1.033 — ABNORMAL HIGH (ref 1.005–1.030)
pH: 5 (ref 5.0–8.0)

## 2020-02-11 LAB — LIPASE, BLOOD: Lipase: 32 U/L (ref 11–51)

## 2020-02-11 LAB — MAGNESIUM: Magnesium: 2 mg/dL (ref 1.7–2.4)

## 2020-02-11 LAB — PREGNANCY, URINE: Preg Test, Ur: NEGATIVE

## 2020-02-11 LAB — PROCALCITONIN: Procalcitonin: <0.1 ng/mL

## 2020-02-11 MED ORDER — ONDANSETRON HCL 4 MG PO TABS: 4.0000 mg | ORAL_TABLET | Freq: Three times a day (TID) | ORAL | 0 refills | Status: AC | PRN

## 2020-02-11 MED ORDER — POTASSIUM CHLORIDE CRYS ER 20 MEQ PO TBCR
80.0000 meq | EXTENDED_RELEASE_TABLET | Freq: Once | ORAL | Status: AC
  Administered 2020-02-11: 80 meq via ORAL
  Filled 2020-02-11: qty 4

## 2020-02-11 MED ORDER — CEPHALEXIN 500 MG PO CAPS: 500.0000 mg | ORAL_CAPSULE | Freq: Four times a day (QID) | ORAL | 0 refills | Status: DC

## 2020-02-11 MED ORDER — CEPHALEXIN 500 MG PO CAPS
500.0000 mg | ORAL_CAPSULE | Freq: Four times a day (QID) | ORAL | 0 refills | Status: AC
Start: 2020-02-11 — End: 2020-02-16

## 2020-02-11 MED ORDER — ONDANSETRON HCL 4 MG PO TABS: 4.0000 mg | ORAL_TABLET | Freq: Three times a day (TID) | ORAL | 0 refills | Status: DC | PRN

## 2020-02-11 MED ORDER — ALUM & MAG HYDROXIDE-SIMETH 200-200-20 MG/5ML PO SUSP
30.0000 mL | Freq: Once | ORAL | Status: AC
  Administered 2020-02-11: 30 mL via ORAL
  Filled 2020-02-11: qty 30

## 2020-02-11 MED ORDER — DICYCLOMINE HCL 10 MG PO CAPS: 10.0000 mg | ORAL_CAPSULE | Freq: Four times a day (QID) | ORAL | 0 refills | Status: DC

## 2020-02-11 MED ORDER — LACTATED RINGERS IV BOLUS
1000.0000 mL | Freq: Once | INTRAVENOUS | Status: AC
  Administered 2020-02-11: 1000 mL via INTRAVENOUS

## 2020-02-11 MED ORDER — ONDANSETRON HCL 4 MG/2ML IJ SOLN
4.0000 mg | Freq: Once | INTRAMUSCULAR | Status: AC
  Administered 2020-02-11: 4 mg via INTRAVENOUS
  Filled 2020-02-11: qty 2

## 2020-02-11 MED ORDER — DICYCLOMINE HCL 10 MG PO CAPS: 10.0000 mg | ORAL_CAPSULE | Freq: Four times a day (QID) | ORAL | 0 refills | Status: AC

## 2020-02-11 MED ORDER — DICYCLOMINE HCL 10 MG PO CAPS
10.0000 mg | ORAL_CAPSULE | Freq: Once | ORAL | Status: AC
  Administered 2020-02-11: 10 mg via ORAL
  Filled 2020-02-11: qty 1

## 2020-02-11 NOTE — ED Triage Notes (Signed)
Pt endorsing abdominal pain since last night. Nauseated and headache.

## 2020-02-11 NOTE — ED Provider Notes (Signed)
[facility] Emergency Department Provider Note  ____________________________________________   Event Date/Time   First MD Initiated Contact with Patient 02/11/20 0017     (approximate)  I have reviewed the triage vital signs and the nursing notes.   HISTORY  Chief Complaint Abdominal Pain (/)   HPI Katherine Herring is a 29 y.o. female with past medical history of heart murmur and recent diagnosis of COVID-19 on 12/23 who has been feeling sick for approximately 3 days with myalgias, fevers, cough, congestion, nausea, and diarrhea as well as development of some crampy abdominal pain today.  Patient states she has noticed some decreased urine output but has not had any burning with urination.  She is not had any blood in her stool and has not been vomiting.  She denies any chest pain, earache, sore throat, rash or acute extremity pain.  She has been taking Tylenol, ibuprofen and OTC elderberry.  She does not smoke or use illegal drugs or drink alcohol daily.  No clear alleviating or aggravating factors.  Patient has not received Covid vaccine.         Past Medical History:  Diagnosis Date  . Heart murmur     Patient Active Problem List   Diagnosis Date Noted  . Muscle spasm of back 11/25/2019  . Bipolar disorder (HCC) 06/29/2017    No past surgical history on file.  Prior to Admission medications   Medication Sig Start Date End Date Taking? Authorizing Provider  cephALEXin (KEFLEX) 500 MG capsule Take 1 capsule (500 mg total) by mouth 4 (four) times daily for 5 days. 02/11/20 02/16/20  Gilles Chiquito, MD  dicyclomine (BENTYL) 10 MG capsule Take 1 capsule (10 mg total) by mouth 4 (four) times daily for 5 days. 02/11/20 02/16/20  Gilles Chiquito, MD  ondansetron (ZOFRAN) 4 MG tablet Take 1 tablet (4 mg total) by mouth every 8 (eight) hours as needed for up to 10 doses for nausea or vomiting. 02/11/20   Gilles Chiquito, MD  promethazine  (PHENERGAN) 12.5 MG tablet Take 1 tablet (12.5 mg total) by mouth every 6 (six) hours as needed. 08/30/19 11/10/19  Willy Eddy, MD    Allergies Tuberculin  Family History  Problem Relation Age of Onset  . Hypertension Mother   . Hypertension Father     Social History Social History   Tobacco Use  . Smoking status: Never Smoker  . Smokeless tobacco: Never Used  Vaping Use  . Vaping Use: Never used  Substance Use Topics  . Alcohol use: Yes    Comment: 1 bottle q weekend  . Drug use: Yes    Types: Marijuana    Comment: last use today    Review of Systems  Review of Systems  Constitutional: Positive for chills, fever and malaise/fatigue.  HENT: Negative for sore throat.   Eyes: Negative for pain.  Respiratory: Positive for cough and shortness of breath. Negative for stridor.   Cardiovascular: Negative for chest pain.  Gastrointestinal: Positive for abdominal pain, diarrhea and nausea.  Genitourinary: Positive for urgency. Negative for dysuria.  Musculoskeletal: Positive for myalgias.  Skin: Negative for rash.  Neurological: Negative for seizures, loss of consciousness and headaches.  Psychiatric/Behavioral: Negative for suicidal ideas.  All other systems reviewed and are negative.     ____________________________________________   PHYSICAL EXAM:  VITAL SIGNS: ED Triage Vitals [02/11/20 0009]  Enc Vitals Group     BP (!) 111/52     Pulse Rate 77  Resp 18     Temp 99.6 F (37.6 C)     Temp Source Oral     SpO2 100 %     Weight      Height      Head Circumference      Peak Flow      Pain Score      Pain Loc      Pain Edu?      Excl. in GC?    Vitals:   02/11/20 0030 02/11/20 0130  BP: 111/69 (!) 106/54  Pulse: 60 64  Resp: 14 12  Temp:    SpO2: 94% 96%   Physical Exam Vitals and nursing note reviewed.  Constitutional:      General: She is not in acute distress.    Appearance: She is well-developed and well-nourished.  HENT:      Head: Normocephalic and atraumatic.     Right Ear: External ear normal.     Left Ear: External ear normal.     Nose: Nose normal.     Mouth/Throat:     Mouth: Mucous membranes are dry.  Eyes:     Conjunctiva/sclera: Conjunctivae normal.  Cardiovascular:     Rate and Rhythm: Normal rate and regular rhythm.     Heart sounds: No murmur heard.   Pulmonary:     Effort: Pulmonary effort is normal. No respiratory distress.     Breath sounds: Normal breath sounds.  Abdominal:     Palpations: Abdomen is soft.     Tenderness: There is abdominal tenderness. There is no right CVA tenderness or left CVA tenderness.  Musculoskeletal:        General: No edema.     Cervical back: Neck supple.     Right lower leg: No edema.     Left lower leg: No edema.  Skin:    General: Skin is warm and dry.     Capillary Refill: Capillary refill takes 2 to 3 seconds.  Neurological:     Mental Status: She is alert and oriented to person, place, and time.  Psychiatric:        Mood and Affect: Mood and affect and mood normal.      ____________________________________________   LABS (all labs ordered are listed, but only abnormal results are displayed)  Labs Reviewed  CBC WITH DIFFERENTIAL/PLATELET - Abnormal; Notable for the following components:      Result Value   WBC 3.7 (*)    All other components within normal limits  COMPREHENSIVE METABOLIC PANEL - Abnormal; Notable for the following components:   Potassium 3.0 (*)    Glucose, Bld 119 (*)    Calcium 8.2 (*)    Albumin 3.4 (*)    All other components within normal limits  URINALYSIS, COMPLETE (UACMP) WITH MICROSCOPIC - Abnormal; Notable for the following components:   Color, Urine AMBER (*)    APPearance TURBID (*)    Specific Gravity, Urine 1.033 (*)    Ketones, ur 5 (*)    Protein, ur >=300 (*)    Bacteria, UA RARE (*)    All other components within normal limits  URINE CULTURE  LIPASE, BLOOD  PREGNANCY, URINE  MAGNESIUM   PROCALCITONIN   ____________________________________________  EKG  Sinus rhythm with a ventricular of 69, first-degree AV block with a PR interval of 270, normal axis, some artifact in V2 without any other clear evidence of acute ischemia. ____________________________________________  ____________________________________________   PROCEDURES  Procedure(s) performed (including Critical Care):  Marland Kitchen  1-3 Lead EKG Interpretation Performed by: Gilles Chiquito, MD Authorized by: Gilles Chiquito, MD     Interpretation: normal     ECG rate assessment: normal     Ectopy: none     Conduction: normal       ____________________________________________   INITIAL IMPRESSION / ASSESSMENT AND PLAN / ED COURSE        Patient presents with [place] to history exam for assessment of some crampy abdominal pain associate with diarrhea nausea is developed today in the setting of recent diagnosis of Covid and other noted above symptoms.  On arrival patient is afebrile hemodynamically stable with borderline BP at 103/68.  Her abdomen is mildly tender throughout with no evidence of peritonitis rebound or guarding.  No CVA tenderness.  Lungs are clear bilaterally.  Differential includes enteritis related to Covid infection, cystitis, cholecystitis, and pancreatitis.  No fever elevated white blood cell count or CVA tenderness to suggest pyelonephritis.  No focal tenderness in the right lower quadrant fever elevated white cell count to suggest appendicitis.  CMP shows evidence hyperkalemia with a K of 3 with no other significant electrolyte or metabolic derangements.  No evidence of cholestasis or hepatitis.  Lipase of 32 is not consistent with acute pancreatitis.  Urine pregnancy test is negative.  UA remarkable for some ketones and greater than 300 protein with rare bacteria and 6-10 WBCs.  However given patient states she has some fullness in her suprapubic area and frequency will treat for possible UTI.   Patient treated with IV fluids, potassium, Zofran and Bentyl.  She was noted tolerate p.o. in ED.  On my reassessment she stated her abdominal cramps had much improved and her abdomen was soft and nontender throughout.  Given stable vitals with otherwise reassuring exam and work-up believe she is safe for discharge with plan for outpatient follow-up.  Discharged stable condition.  Strict return cautions advised and discussed.  Rx written for Zofran, Bentyl and Keflex.  Advised patient on importance of staying adequately hydrated and using Zofran as needed that she could keep taking fluids with calories electrolytes.  Patient voiced understanding agreement this plan.  States return immediately to the emergency room if she experienced any new or acute worsening of symptoms.  ____________________________________________   FINAL CLINICAL IMPRESSION(S) / ED DIAGNOSES  Final diagnoses:  COVID-19  Dehydration  Cystitis  Hypokalemia  Enteritis    Medications  lactated ringers bolus 1,000 mL (1,000 mLs Intravenous New Bag/Given 02/11/20 0113)  ondansetron (ZOFRAN) injection 4 mg (4 mg Intravenous Given 02/11/20 0114)  alum & mag hydroxide-simeth (MAALOX/MYLANTA) 200-200-20 MG/5ML suspension 30 mL (30 mLs Oral Given 02/11/20 0114)  dicyclomine (BENTYL) capsule 10 mg (10 mg Oral Given 02/11/20 0114)  potassium chloride SA (KLOR-CON) CR tablet 80 mEq (80 mEq Oral Given 02/11/20 0120)     ED Discharge Orders         Ordered    cephALEXin (KEFLEX) 500 MG capsule  4 times daily        02/11/20 0233    ondansetron (ZOFRAN) 4 MG tablet  Every 8 hours PRN        02/11/20 0233    dicyclomine (BENTYL) 10 MG capsule  4 times daily        02/11/20 8657           Note:  This document was prepared using Dragon voice recognition software and may include unintentional dictation errors.   Gilles Chiquito, MD 02/11/20 [phone]

## 2020-02-12 ENCOUNTER — Telehealth: Payer: Self-pay | Admitting: Family

## 2020-02-12 LAB — URINE CULTURE: Culture: NO GROWTH

## 2020-02-12 NOTE — Telephone Encounter (Signed)
Called to Discuss with patient about Covid symptoms and the use of the monoclonal antibody infusion for those with mild to moderate Covid symptoms and at a high risk of hospitalization.     Pt appears to qualify for this infusion due to co-morbid conditions and/or a member of an at-risk group in accordance with the FDA Emergency Use Authorization.    Katherine Herring was seen at the [facility] ED on 12/25 with myalgias, fevers, cough, congestion, nausea and diarrhea. Symptoms started on 12/22. She tested positive for COVID on 12/23. Qualifying risk factors include BMI >25 (42) and she is unvaccinated.   I was unable to contact [name] via phone and left a voicemail with the hotline number. MyChart message was also sent.  Marcos Eke, NP 02/12/2020 9:18 AM

## 2020-02-13 ENCOUNTER — Other Ambulatory Visit: Payer: Self-pay

## 2020-02-13 ENCOUNTER — Ambulatory Visit
Admission: EM | Admit: 2020-02-13 | Discharge: 2020-02-13 | Disposition: A | Discharge status: Home / Self Care | Payer: 59

## 2020-02-13 DIAGNOSIS — U071 COVID-19: Secondary | ICD-10-CM | POA: Diagnosis not present

## 2020-02-13 NOTE — ED Triage Notes (Signed)
Patient states that she is covid positive and feels weak and fatigue. Patient states that feels exhausted even bathing herself.

## 2020-02-13 NOTE — ED Notes (Addendum)
Patient became destructive upon discharge. Patient ripped out drawers in the room and spit on to napkins (she is covid positive). Patient through all the supplies in the room all over the room and ripped accessories from the computer. Patient yelling profanities while walking out the door. AC notified.

## 2020-02-13 NOTE — Discharge Instructions (Signed)
You need to follow-up with the infusion clinic for monoclonal antibody infusion.

## 2020-02-13 NOTE — ED Provider Notes (Signed)
[facility]    CSN: [id number] Arrival date & time: 02/13/20  1249      History   Chief Complaint Chief Complaint  Patient presents with   Covid Positive    HPI Katherine Herring is a 29 y.o. female.   HPI   29 year old female here for evaluation of weakness and fatigue due to being Covid positive.  Patient was diagnosed on 02/09/20 in the ER at [facility], she was evaluated again on 02/11/20 are for being Covid positive.  She was referred to the monoclonal antibody infusion clinic who attempted to reach her yesterday 02/12/20 and left a voicemail which she did not return.  Patient reports that walking and bathing causes fatigue.  She states that on Christmas eve she walked a mile down the path and since then she has simply been unable to recover.  Patient is upset because no one has given her any medicine to help her with her symptoms.  Patient reports that she does not have the energy to bathe herself.  Past Medical History:  Diagnosis Date   Heart murmur     Patient Active Problem List   Diagnosis Date Noted   Muscle spasm of back 11/25/2019   Bipolar disorder (HCC) 06/29/2017    History reviewed. No pertinent surgical history.  OB History   No obstetric history on file.      Home Medications    Prior to Admission medications   Medication Sig Start Date End Date Taking? Authorizing Provider  cephALEXin (KEFLEX) 500 MG capsule Take 1 capsule (500 mg total) by mouth 4 (four) times daily for 5 days. 02/11/20 02/16/20 Yes Gilles Chiquito, MD  dicyclomine (BENTYL) 10 MG capsule Take 1 capsule (10 mg total) by mouth 4 (four) times daily for 5 days. 02/11/20 02/16/20 Yes Gilles Chiquito, MD  ondansetron (ZOFRAN) 4 MG tablet Take 1 tablet (4 mg total) by mouth every 8 (eight) hours as needed for up to 10 doses for nausea or vomiting. 02/11/20  Yes Gilles Chiquito, MD  promethazine (PHENERGAN) 12.5 MG tablet Take 1 tablet (12.5 mg total) by  mouth every 6 (six) hours as needed. 08/30/19 11/10/19  Willy Eddy, MD    Family History Family History  Problem Relation Age of Onset   Hypertension Mother    Hypertension Father     Social History Social History   Tobacco Use   Smoking status: Never Smoker   Smokeless tobacco: Never Used  Building services engineer Use: Never used  Substance Use Topics   Alcohol use: Yes    Comment: 1 bottle q weekend   Drug use: Yes    Types: Marijuana    Comment: last use today     Allergies   Tuberculin   Review of Systems Review of Systems  Constitutional: Positive for fatigue.     Physical Exam Triage Vital Signs ED Triage Vitals  Enc Vitals Group     BP 02/13/20 1524 (!) 120/99     Pulse Rate 02/13/20 1524 74     Resp 02/13/20 1524 18     Temp 02/13/20 1524 98.5 F (36.9 C)     Temp Source 02/13/20 1524 Oral     SpO2 02/13/20 1524 100 %     Weight 02/13/20 1518 235 lb (106.6 kg)     Height 02/13/20 1518 5\' 3"  (1.6 m)     Head Circumference --      Peak Flow --  Pain Score 02/13/20 1517 0     Pain Loc --      Pain Edu? --      Excl. in GC? --    No data found.  Updated Vital Signs BP (!) 120/99 (BP Location: Left Arm)    Pulse 74    Temp 98.5 F (36.9 C) (Oral)    Resp 18    Ht 5\' 3"  (1.6 m)    Wt 235 lb (106.6 kg)    LMP 02/11/2020    SpO2 100%    BMI 41.63 kg/m   Visual Acuity Right Eye Distance:   Left Eye Distance:   Bilateral Distance:    Right Eye Near:   Left Eye Near:    Bilateral Near:     Physical Exam Constitutional:      General: She is not in acute distress.    Appearance: Normal appearance. She is not toxic-appearing.  HENT:     Head: Normocephalic and atraumatic.  Skin:    General: Skin is warm and dry.  Neurological:     General: No focal deficit present.     Mental Status: She is alert and oriented to person, place, and time.      UC Treatments / Results  Labs (all labs ordered are listed, but only abnormal  results are displayed) Labs Reviewed - No data to display  EKG   Radiology No results found.  Procedures Procedures (including critical care time)  Medications Ordered in UC Medications - No data to display  Initial Impression / Assessment and Plan / UC Course  I have reviewed the triage vital signs and the nursing notes.  Pertinent labs & imaging results that were available during my care of the patient were reviewed by me and considered in my medical decision making (see chart for details).   Patient is here wanting help with her symptoms of fatigue and feeling weak due to being Covid positive.  I discussed with the patient that we do not have many good therapies beyond the monoclonal antibody infusion.  Patient encouraged to call the antibody infusion clinic to set up an infusion.  At that point patient became irate and started to curse because she sat in the parking lot for 3 hours waiting to be seen to be told that the infusion clinic that she was referred to was her best option.  Patient declared that she thought it was disrespectful.  Advised patient that I would not tolerate being cussed at and left the room.  I told the patient that she was free to leave.  Patient then proceeded to empty out of the drawers on the floor and tore the label printer from the computer before yelling profanities and exiting the lobby.  [place] PD was called to report the situation.   Final Clinical Impressions(s) / UC Diagnoses   Final diagnoses:  COVID-19   Discharge Instructions   None    ED Prescriptions    None     PDMP not reviewed this encounter.   02/13/2020, NP 02/13/20 1605

## 2020-04-03 ENCOUNTER — Ambulatory Visit: Payer: Self-pay

## 2020-04-04 ENCOUNTER — Ambulatory Visit: Payer: Self-pay

## 2021-08-04 IMAGING — CR DG CHEST 2V
2 series · 2 of 2 positions shown · non-contrast
Comparison: None.

CLINICAL DATA: Productive cough and shortness of breath.

EXAM:
CHEST - 2 VIEW

[chest pa]
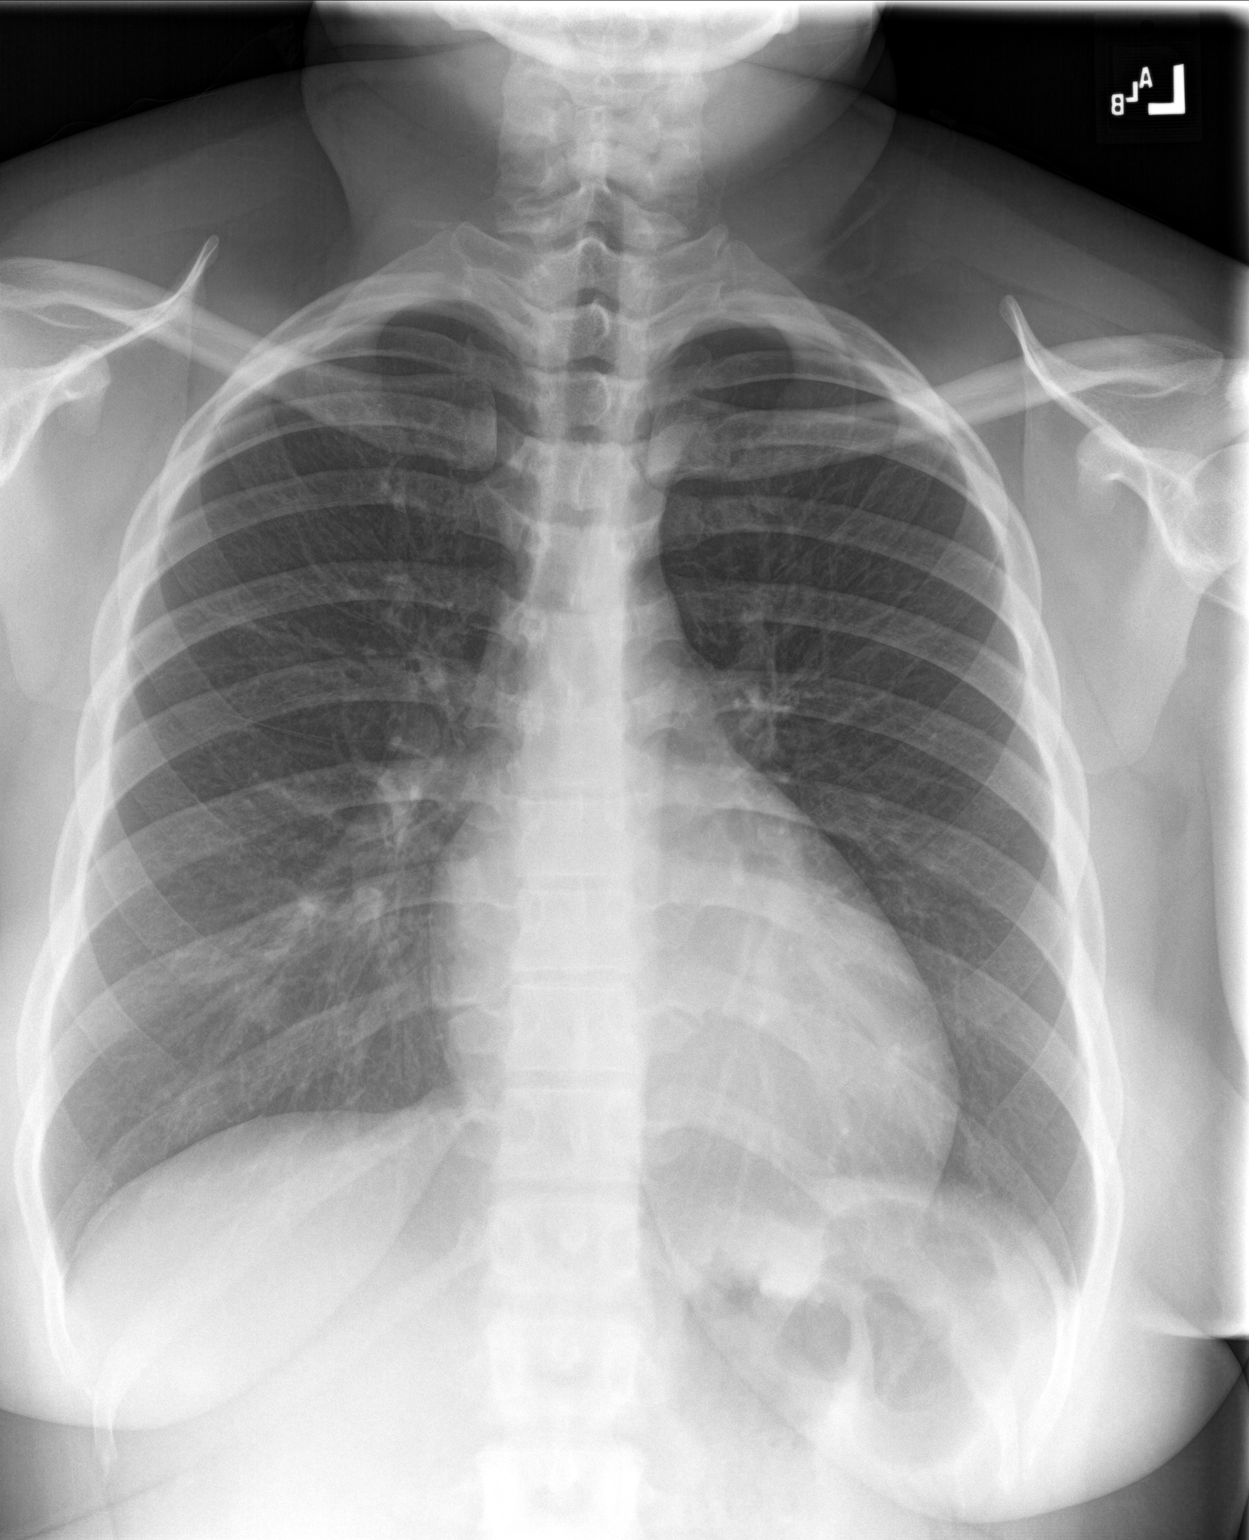

[chest lat]
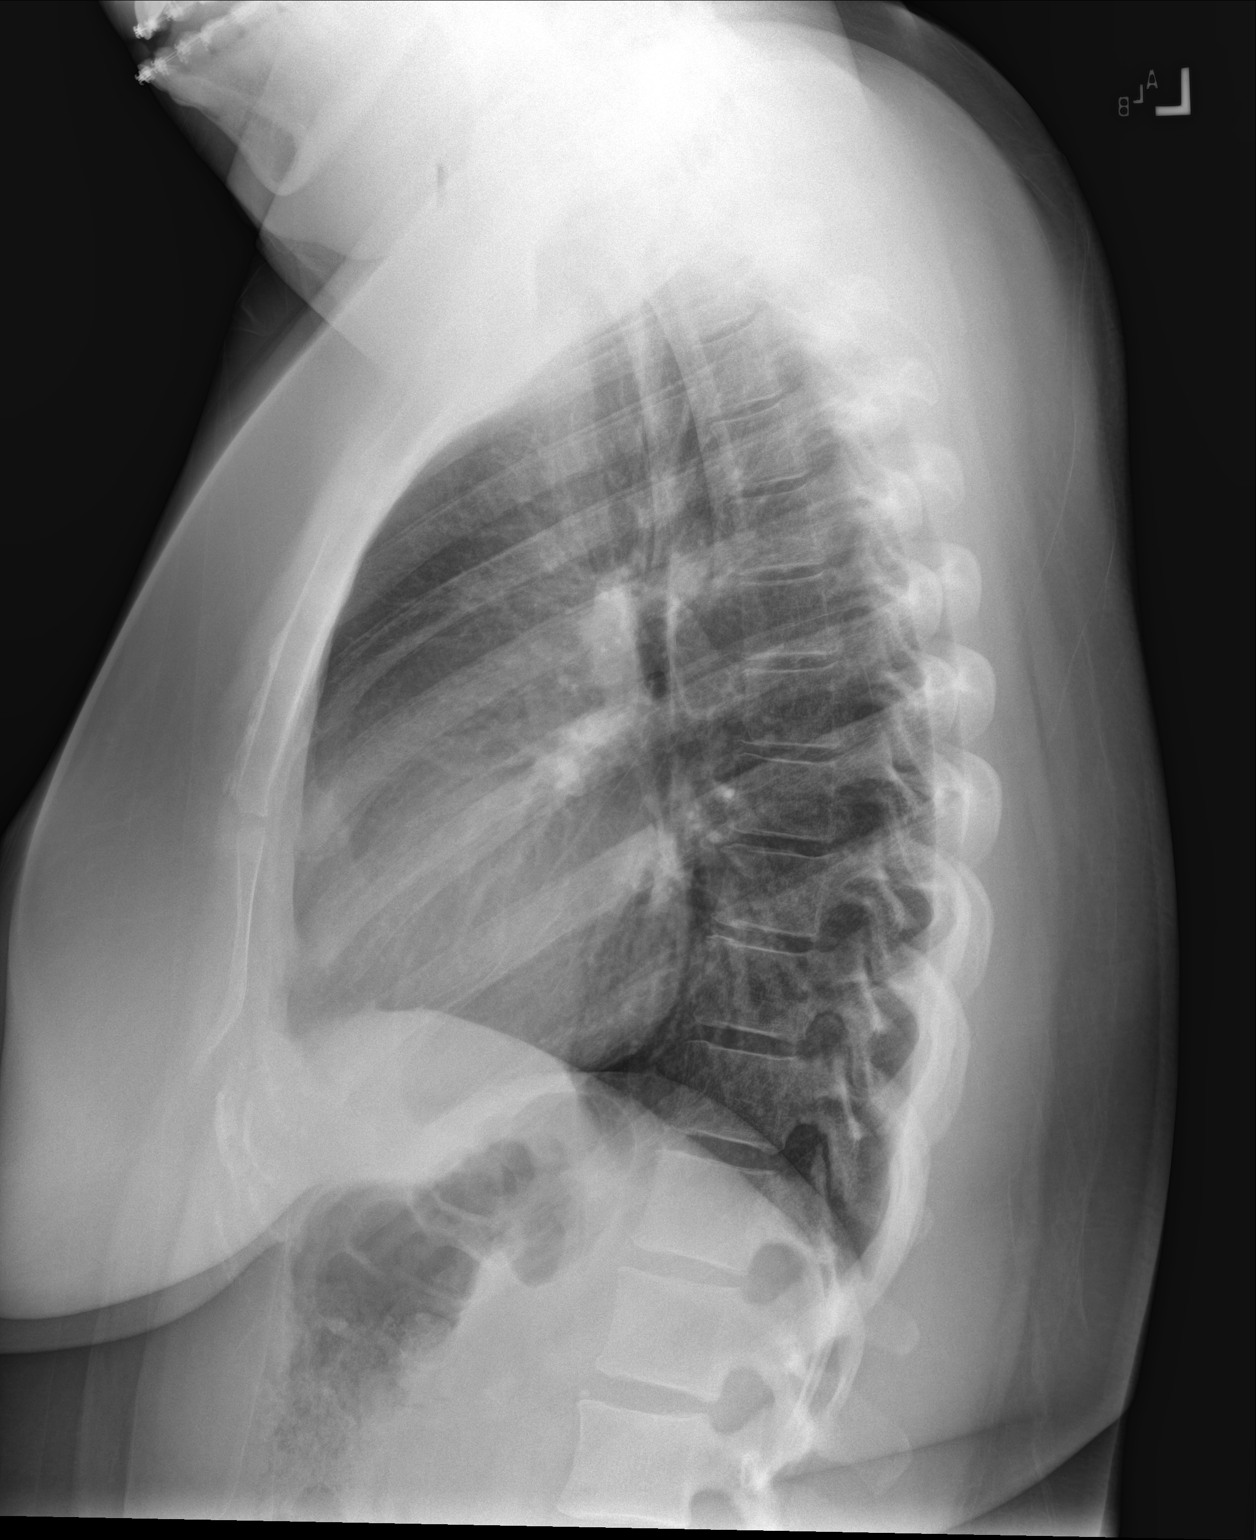

[2 of 2 positions shown; findings below may reference images not displayed]

FINDINGS: Heart size is normal. Mediastinal shadows are normal. The lungs are
clear. No bronchial thickening. No infiltrate, mass, effusion or
collapse. Pulmonary vascularity is normal. No bony abnormality.
IMPRESSION: Normal chest
